# Patient Record
Sex: Female | Born: 1935 | Race: White | Hispanic: No | Marital: Single | Smoking: Never smoker
Health system: Southern US, Community
[De-identification: ages and names within clinical notes are randomized; demographics above are authoritative.]

## PROBLEM LIST (undated history)

## (undated) DIAGNOSIS — I639 Cerebral infarction, unspecified: Secondary | ICD-10-CM

## (undated) DIAGNOSIS — C801 Malignant (primary) neoplasm, unspecified: Secondary | ICD-10-CM

## (undated) DIAGNOSIS — I10 Essential (primary) hypertension: Secondary | ICD-10-CM

## (undated) HISTORY — DX: Cerebral infarction, unspecified: I63.9

## (undated) HISTORY — PX: COLON SURGERY: SHX602

## (undated) HISTORY — PX: JOINT REPLACEMENT: SHX530

---

## 2017-01-14 ENCOUNTER — Emergency Department (HOSPITAL_COMMUNITY): Payer: Medicare Other

## 2017-01-14 ENCOUNTER — Inpatient Hospital Stay (HOSPITAL_COMMUNITY)
Admission: EM | Admit: 2017-01-14 | Discharge: 2017-01-16 | DRG: 055 | Disposition: A | Discharge status: Home / Self Care | Payer: Medicare Other | Attending: Family Medicine | Admitting: Family Medicine

## 2017-01-14 ENCOUNTER — Encounter (HOSPITAL_COMMUNITY): Payer: Self-pay | Admitting: Emergency Medicine

## 2017-01-14 DIAGNOSIS — E876 Hypokalemia: Secondary | ICD-10-CM | POA: Diagnosis present

## 2017-01-14 DIAGNOSIS — I10 Essential (primary) hypertension: Secondary | ICD-10-CM | POA: Diagnosis present

## 2017-01-14 DIAGNOSIS — E785 Hyperlipidemia, unspecified: Secondary | ICD-10-CM | POA: Diagnosis present

## 2017-01-14 DIAGNOSIS — T464X6A Underdosing of angiotensin-converting-enzyme inhibitors, initial encounter: Secondary | ICD-10-CM | POA: Diagnosis present

## 2017-01-14 DIAGNOSIS — C7931 Secondary malignant neoplasm of brain: Principal | ICD-10-CM | POA: Diagnosis present

## 2017-01-14 DIAGNOSIS — R51 Headache: Secondary | ICD-10-CM | POA: Diagnosis present

## 2017-01-14 DIAGNOSIS — M1710 Unilateral primary osteoarthritis, unspecified knee: Secondary | ICD-10-CM | POA: Diagnosis present

## 2017-01-14 DIAGNOSIS — X58XXXA Exposure to other specified factors, initial encounter: Secondary | ICD-10-CM | POA: Diagnosis present

## 2017-01-14 DIAGNOSIS — Z87891 Personal history of nicotine dependence: Secondary | ICD-10-CM

## 2017-01-14 DIAGNOSIS — I639 Cerebral infarction, unspecified: Secondary | ICD-10-CM | POA: Diagnosis present

## 2017-01-14 DIAGNOSIS — M1610 Unilateral primary osteoarthritis, unspecified hip: Secondary | ICD-10-CM | POA: Diagnosis present

## 2017-01-14 DIAGNOSIS — R29810 Facial weakness: Secondary | ICD-10-CM | POA: Diagnosis present

## 2017-01-14 DIAGNOSIS — Z85038 Personal history of other malignant neoplasm of large intestine: Secondary | ICD-10-CM | POA: Diagnosis not present

## 2017-01-14 DIAGNOSIS — R4781 Slurred speech: Secondary | ICD-10-CM | POA: Diagnosis present

## 2017-01-14 DIAGNOSIS — R1310 Dysphagia, unspecified: Secondary | ICD-10-CM | POA: Diagnosis present

## 2017-01-14 DIAGNOSIS — G8191 Hemiplegia, unspecified affecting right dominant side: Secondary | ICD-10-CM | POA: Diagnosis present

## 2017-01-14 DIAGNOSIS — C3490 Malignant neoplasm of unspecified part of unspecified bronchus or lung: Secondary | ICD-10-CM | POA: Diagnosis present

## 2017-01-14 DIAGNOSIS — C7951 Secondary malignant neoplasm of bone: Secondary | ICD-10-CM | POA: Diagnosis present

## 2017-01-14 DIAGNOSIS — Z880 Allergy status to penicillin: Secondary | ICD-10-CM

## 2017-01-14 DIAGNOSIS — R112 Nausea with vomiting, unspecified: Secondary | ICD-10-CM | POA: Diagnosis present

## 2017-01-14 DIAGNOSIS — G9389 Other specified disorders of brain: Secondary | ICD-10-CM | POA: Diagnosis present

## 2017-01-14 DIAGNOSIS — Z923 Personal history of irradiation: Secondary | ICD-10-CM | POA: Diagnosis not present

## 2017-01-14 DIAGNOSIS — T457X6A Underdosing of anticoagulant antagonist, vitamin K and other coagulants, initial encounter: Secondary | ICD-10-CM | POA: Diagnosis present

## 2017-01-14 DIAGNOSIS — Z79899 Other long term (current) drug therapy: Secondary | ICD-10-CM | POA: Diagnosis not present

## 2017-01-14 DIAGNOSIS — Z7901 Long term (current) use of anticoagulants: Secondary | ICD-10-CM

## 2017-01-14 DIAGNOSIS — I6603 Occlusion and stenosis of bilateral middle cerebral arteries: Secondary | ICD-10-CM | POA: Diagnosis present

## 2017-01-14 DIAGNOSIS — Z91128 Patient's intentional underdosing of medication regimen for other reason: Secondary | ICD-10-CM

## 2017-01-14 DIAGNOSIS — I4891 Unspecified atrial fibrillation: Secondary | ICD-10-CM | POA: Diagnosis present

## 2017-01-14 HISTORY — DX: Essential (primary) hypertension: I10

## 2017-01-14 HISTORY — DX: Malignant (primary) neoplasm, unspecified: C80.1

## 2017-01-14 LAB — CBC
HCT: 39.8 % (ref 36.0–46.0)
Hemoglobin: 13.3 g/dL (ref 12.0–15.0)
MCH: 31.1 pg (ref 26.0–34.0)
MCHC: 33.4 g/dL (ref 30.0–36.0)
MCV: 93 fL (ref 78.0–100.0)
PLATELETS: 166 10*3/uL (ref 150–400)
RBC: 4.28 MIL/uL (ref 3.87–5.11)
RDW: 13.5 % (ref 11.5–15.5)
WBC: 6.7 10*3/uL (ref 4.0–10.5)

## 2017-01-14 LAB — DIFFERENTIAL
BASOS PCT: 0 %
Basophils Absolute: 0 10*3/uL (ref 0.0–0.1)
Eosinophils Absolute: 0.1 10*3/uL (ref 0.0–0.7)
Eosinophils Relative: 1 %
Lymphocytes Relative: 18 %
Lymphs Abs: 1.2 10*3/uL (ref 0.7–4.0)
MONO ABS: 0.6 10*3/uL (ref 0.1–1.0)
Monocytes Relative: 9 %
NEUTROS ABS: 4.9 10*3/uL (ref 1.7–7.7)
NEUTROS PCT: 72 %

## 2017-01-14 LAB — I-STAT TROPONIN, ED: Troponin i, poc: 0.09 ng/mL (ref 0.00–0.08)

## 2017-01-14 LAB — URINALYSIS, ROUTINE W REFLEX MICROSCOPIC
Bilirubin Urine: NEGATIVE
Glucose, UA: NEGATIVE mg/dL
Hgb urine dipstick: NEGATIVE
Ketones, ur: NEGATIVE mg/dL
Nitrite: NEGATIVE
Protein, ur: NEGATIVE mg/dL
Specific Gravity, Urine: 1.023 (ref 1.005–1.030)
pH: 7 (ref 5.0–8.0)

## 2017-01-14 LAB — COMPREHENSIVE METABOLIC PANEL
ALBUMIN: 4.1 g/dL (ref 3.5–5.0)
ALT: 10 U/L — AB (ref 14–54)
ANION GAP: 9 (ref 5–15)
AST: 16 U/L (ref 15–41)
Alkaline Phosphatase: 148 U/L — ABNORMAL HIGH (ref 38–126)
BUN: 16 mg/dL (ref 6–20)
CHLORIDE: 104 mmol/L (ref 101–111)
CO2: 26 mmol/L (ref 22–32)
CREATININE: 0.8 mg/dL (ref 0.44–1.00)
Calcium: 9.3 mg/dL (ref 8.9–10.3)
GFR calc non Af Amer: >60 mL/min (ref >60)
Glucose, Bld: 108 mg/dL — ABNORMAL HIGH (ref 65–99)
Potassium: 3.2 mmol/L — ABNORMAL LOW (ref 3.5–5.1)
SODIUM: 139 mmol/L (ref 135–145)
Total Bilirubin: 0.7 mg/dL (ref 0.3–1.2)
Total Protein: 6.3 g/dL — ABNORMAL LOW (ref 6.5–8.1)

## 2017-01-14 LAB — I-STAT CHEM 8, ED
BUN: 19 mg/dL (ref 6–20)
CALCIUM ION: 1.12 mmol/L — AB (ref 1.15–1.40)
CHLORIDE: 102 mmol/L (ref 101–111)
Creatinine, Ser: 0.8 mg/dL (ref 0.44–1.00)
Glucose, Bld: 107 mg/dL — ABNORMAL HIGH (ref 65–99)
HEMATOCRIT: 39 % (ref 36.0–46.0)
Hemoglobin: 13.3 g/dL (ref 12.0–15.0)
POTASSIUM: 3.2 mmol/L — AB (ref 3.5–5.1)
SODIUM: 142 mmol/L (ref 135–145)
TCO2: 28 mmol/L (ref 22–32)

## 2017-01-14 LAB — RAPID URINE DRUG SCREEN, HOSP PERFORMED
Amphetamines: NOT DETECTED
BENZODIAZEPINES: NOT DETECTED
Barbiturates: NOT DETECTED
COCAINE: NOT DETECTED
Opiates: POSITIVE — AB
Tetrahydrocannabinol: NOT DETECTED

## 2017-01-14 LAB — ETHANOL

## 2017-01-14 LAB — APTT: APTT: 24 s (ref 24–36)

## 2017-01-14 LAB — PROTIME-INR
INR: 1.09
PROTHROMBIN TIME: 14 s (ref 11.4–15.2)

## 2017-01-14 MED ORDER — IOPAMIDOL (ISOVUE-370) INJECTION 76%
INTRAVENOUS | Status: AC
  Administered 2017-01-14: 50 mL
  Filled 2017-01-14: qty 50

## 2017-01-14 MED ORDER — DILTIAZEM HCL 30 MG PO TABS: 30.0000 mg | ORAL_TABLET | Freq: Once | ORAL | Status: DC

## 2017-01-14 MED ORDER — HYDRALAZINE HCL 20 MG/ML IJ SOLN: 5.0000 mg | INTRAMUSCULAR | Status: DC | PRN

## 2017-01-14 MED ORDER — ATORVASTATIN CALCIUM 40 MG PO TABS
40.0000 mg | ORAL_TABLET | Freq: Every day | ORAL | Status: DC
  Administered 2017-01-15: 40 mg via ORAL
  Filled 2017-01-14: qty 1

## 2017-01-14 MED ORDER — POTASSIUM CHLORIDE 10 MEQ/100ML IV SOLN
10.0000 meq | INTRAVENOUS | Status: AC
  Administered 2017-01-15 (×3): 10 meq via INTRAVENOUS
  Filled 2017-01-14 (×3): qty 100

## 2017-01-14 NOTE — H&P (Signed)
[facility] Admission History and Physical Service Pager: [phone]  Patient name: Kelly Frost Medical record number: [id number] Date of birth: 10-05-1928 Age: 83 y.o. Gender: female  Primary Care Provider: System, Pcp Not In Consultants: Neurology Code Status: Full  Chief Complaint: R sided weakness, slurred speech   Assessment and Plan: Kelly Frost is a 83 y.o. female presenting with R sided weakness and slurred speech since this evening. PMH is significant for Afib, HTN, lung cancer with mets to spine and brain, and h/o colon cancer.  R sided weakness, slurred speech, r/o stroke Last known well time around 7:40pm this evening. Patient's daughter noticed slurred speech, gaze deviation and stumbling when patient got up from sitting to walk. With no prior history of stroke in the past. Has been missing some days of her medication including Eliquis and Lisinopril due to severe headache after radiation. In the ED, Code Stroke was called, CT head showed no acute intracranial hemorrhage. CTA head/neck with no emergent vessel occlusion but severe stenosis noted in bilateral mid to distal middle cerebral arteries. Patient not endorsing chest pain but istat trop elevated to 0.09. EKG NSR. BP 176/87 on admission.  Labs notable for K 3.2. UDS positive for opiates but takes Norco for hip pain. U/A large leuks, neg nitrites, hyaline casts, few bacteria, TNTC WBC although asymptomatic bacteruria , therefore treatment is not warranted. Difficult to tell if R sided weakness is new as patient's daughter states she does have some R sided weakness at baseline and walks with a cane. However, R sided facial droop and slurred speech is new and present at the time of exam. Current known risk factors for stroke include HTN, afib, FH of stroke. CHADsVASC score 4.  Neurology consulted in ED -- she is not a candidate for tPA and recommend holding home Eliquis for now.  - Admit to telemetry  observation, Dr. Erin Hearing attending -continuous cardiac monitoring  -Neurology following, appreciate recs  - Brain MRI without contrast  - trend trops  - TTE with bubble study  - BMP, CBC - neuro checks q4h - NPO pending swallow study - PT/OT/SLP recs  - risk stratification labs: A1c, lipid panel, TSH -SCDs - vitals per unit routine   Afib  On Eliquis. In regular rhythm on exam with HR 80. Has missed some doses over the last month due to fatigue and headaches from radiation treatment.  More recently, she has a caregiver who helps her with her medications and she therefore has been taking her medication over the past week or so.  - continuous cardiac monitoring - holding eliquis until brain MRI  - ASA 325mg  -will place SCDs for anticoagulation   HTN Allowing for permissive hypertension with parameters. BP 176/87 on admission. On Lisinopril 5 mg daily at home.  - holding home lisinopril - hydralazine PRN for SBP >210 -monitor blood pressures  Hypokalemia K 3.2 on admission.  - replete with KCl 85meq as she is NPO currently  -daily BMET   Hip and leg pain  Likely due to OA, has had for some time. Saw Ortho last week and received injection in her hip. Previously has also had steroid injections in her knee. Takes Norco PRN for pain. - Tylenol Q6 PRN - holding home Norco for now   H/o Lung and Colon cancer  With known mets to brain and spine. S/p 10 day radiation treatment to brain about a month ago. Followed by Dr. Hezzie Bump at [place].    FEN/GI: NPO  until swallow study, SLIV  Prophylaxis: SCDs   Disposition: admit for observation, tele  History of Present Illness:  Kelly Frost is a 83 y.o. female presenting with R sided weakness and slurred speech since this evening. PMH is significant for Afib, HTN, lung cancer with mets to spine and brain, h/o colon cancer.  This evening she was visiting her daughter and son-in-law eating dinner and watching TV when they noticed her speech  was slurred and stumbled when she got up and walked across the room. The daughter states she has had some R sided weakness for some time and normally walks with a cane. She lives alone in [place] and is able to perform all of her ADLs and IADLs at baseline but has been having a caregiver come to her home as she has been more lethargic and not wanting to eat and forgetting to take medications over the past month due to her recent brain radiation that ended a month ago. She did not take her medications this morning.  She also endorses a continued headache for the past month since radiation that is debilitating. It is located in back of head where the tumor used to be.  She follows with Dr. Hezzie Bump at [place].    Review Of Systems: Per HPI with the following additions:   Review of Systems  Respiratory: Negative for shortness of breath.   Cardiovascular: Negative for chest pain.  Neurological: Positive for focal weakness and headaches.   Patient Active Problem List   Diagnosis Date Noted  . Stroke [facility]) 01/14/2017   Past Medical History: Past Medical History:  Diagnosis Date  . Cancer ([place])   . Hypertension    Past Surgical History: Past Surgical History:  Procedure Laterality Date  . COLON SURGERY    . JOINT REPLACEMENT     Social History: Social History  Substance Use Topics  . Smoking status: Never Smoker  . Smokeless tobacco: Never Used  . Alcohol use Yes   Additional social history: Former smoker, quit 30 years ago, smoked 2 PPD x 30 years.  Occasional alcohol use.  No illicit drug use.   Please also refer to relevant sections of EMR.  Family History: No family history on file. Father died from stroke at 11yo   Mother died from heart failure in 38s  Allergies and Medications: Allergies  Allergen Reactions  . Penicillins Shortness Of Breath and Rash   No current facility-administered medications on file prior to encounter.    No current outpatient prescriptions on  file prior to encounter.   Objective: BP (!) 193/81 (BP Location: Right Arm)   Pulse 82   Temp 98.7 F (37.1 C) (Oral)   Resp 16   Ht 5\' 5"  (1.651 m)   Wt 151 lb 8 oz (68.7 kg)   SpO2 96%   BMI 25.21 kg/m   Exam: General: Elderly female lying in bed, in NAD Eyes: EOMI  Cardiovascular: RRR, systolic murmur  Respiratory: CTA bilaterally, no wheezes/crackles  Gastrointestinal: soft, slightly tender to palpation in RLQ, non distended  Derm: WWP, no LE edema  MSK: Full ROM, strength 5/5 to U/LE bilaterally.   Neuro: Alert and oriented, speech slurred. Extraocular movements intact.  Intact symmetric sensation to light touch of face and extremities bilaterally.  Hearing grossly intact bilaterally.  Tongue protrudes normally with no deviation. Shoulder shrug symmetric. R sided facial droop. Finger to nose slowed but normal.   Labs and Imaging: CBC BMET   Recent Labs Lab 01/14/17 2032  01/14/17 2038  WBC 6.7  --   HGB 13.3 13.3  HCT 39.8 39.0  PLT 166  --     Recent Labs Lab 01/14/17 2032 01/14/17 2038  NA 139 142  K 3.2* 3.2*  CL 104 102  CO2 26  --   BUN 16 19  CREATININE 0.80 0.80  GLUCOSE 108* 107*  CALCIUM 9.3  --      Ct Angio Head W Or Wo Contrast  Result Date: 01/14/2017 CLINICAL DATA:  Follow-up stroke. Slurred speech. History of hypertension and cancer. EXAM: CT ANGIOGRAPHY HEAD AND NECK TECHNIQUE: Multidetector CT imaging of the head and neck was performed using the standard protocol during bolus administration of intravenous contrast. Multiplanar CT image reconstructions and MIPs were obtained to evaluate the vascular anatomy. Carotid stenosis measurements (when applicable) are obtained utilizing NASCET criteria, using the distal internal carotid diameter as the denominator. CONTRAST:  50 cc Isovue 370 COMPARISON:  CT HEAD January 14, 2017 at 2038 hours FINDINGS: CTA NECK AORTIC ARCH: Normal appearance of the thoracic arch, mild calcific atherosclerosis The  origins of the innominate, left Common carotid artery and subclavian artery are widely patent. RIGHT CAROTID SYSTEM: Common carotid artery is widely patent, mild calcific atherosclerosis. Mild eccentric calcific atherosclerosis carotid bifurcation without hemodynamically significant stenosis by NASCET criteria. Normal appearance of the included internal carotid artery. LEFT CAROTID SYSTEM: Common carotid artery is widely patent, mild calcific atherosclerosis. Moderate eccentric calcific atherosclerosis carotid bifurcation without hemodynamically significant stenosis by NASCET criteria. Normal appearance of the included internal carotid artery. VERTEBRAL ARTERIES:Codominant vertebral artery's. Vertebral artery's are widely patent, mild extrinsic compression due to degenerative cervical spine. SKELETON: No acute osseous process though bone windows have not been submitted. Multilevel severe cervical spondylosis with facet fusion. OTHER NECK: Soft tissues of the neck are nonacute though, not tailored for evaluation. Subcentimeter RIGHT thyroid nodule, below size followup recommendation. UPPER CHEST: Small layering LEFT pleural effusion. Biapical pleuroparenchymal scarring. Calcified granuloma RIGHT lung apex. CTA HEAD ANTERIOR CIRCULATION: Patent cervical internal carotid arteries, petrous, cavernous and supra clinoid internal carotid arteries. Widely patent anterior communicating artery. Patent anterior and middle cerebral arteries. Severe stenosis RIGHT M3 segments. Moderate general middle cerebral artery luminal regularity. Dolichoectasia seen with chronic hypertension. No large vessel occlusion, contrast extravasation or aneurysm. POSTERIOR CIRCULATION: Patent vertebral arteries, vertebrobasilar junction and basilar artery, as well as main branch vessels. Mild stenosis LEFT P1 origin. Patent posterior cerebral arteries, moderate to severe tandem stenoses. Thready bilateral posterior communicating artery's present.  No large vessel occlusion, contrast extravasation or aneurysm. VENOUS SINUSES: Major dural venous sinuses are patent though not tailored for evaluation on this angiographic examination. ANATOMIC VARIANTS: None. DELAYED PHASE: Not performed. MIP images reviewed. IMPRESSION: CTA NECK: 1. Atherosclerosis without hemodynamically significant stenosis or acute vascular process. 2. Small LEFT pleural effusion.  Recommend chest radiograph. CTA HEAD: 1. No emergent vessel occlusion. 2. Intracranial atherosclerosis resulting in moderate to severe stenoses bilateral posterior cerebral artery's, severe stenosis bilateral mid to distal middle cerebral artery's. Electronically Signed   By: Elon Alas M.D.   On: 01/14/2017 21:26   Ct Angio Neck W Or Wo Contrast  Result Date: 01/14/2017 CLINICAL DATA:  Follow-up stroke. Slurred speech. History of hypertension and cancer. EXAM: CT ANGIOGRAPHY HEAD AND NECK TECHNIQUE: Multidetector CT imaging of the head and neck was performed using the standard protocol during bolus administration of intravenous contrast. Multiplanar CT image reconstructions and MIPs were obtained to evaluate the vascular anatomy. Carotid stenosis measurements (when applicable) are  obtained utilizing NASCET criteria, using the distal internal carotid diameter as the denominator. CONTRAST:  50 cc Isovue 370 COMPARISON:  CT HEAD January 14, 2017 at 2038 hours FINDINGS: CTA NECK AORTIC ARCH: Normal appearance of the thoracic arch, mild calcific atherosclerosis The origins of the innominate, left Common carotid artery and subclavian artery are widely patent. RIGHT CAROTID SYSTEM: Common carotid artery is widely patent, mild calcific atherosclerosis. Mild eccentric calcific atherosclerosis carotid bifurcation without hemodynamically significant stenosis by NASCET criteria. Normal appearance of the included internal carotid artery. LEFT CAROTID SYSTEM: Common carotid artery is widely patent, mild calcific  atherosclerosis. Moderate eccentric calcific atherosclerosis carotid bifurcation without hemodynamically significant stenosis by NASCET criteria. Normal appearance of the included internal carotid artery. VERTEBRAL ARTERIES:Codominant vertebral artery's. Vertebral artery's are widely patent, mild extrinsic compression due to degenerative cervical spine. SKELETON: No acute osseous process though bone windows have not been submitted. Multilevel severe cervical spondylosis with facet fusion. OTHER NECK: Soft tissues of the neck are nonacute though, not tailored for evaluation. Subcentimeter RIGHT thyroid nodule, below size followup recommendation. UPPER CHEST: Small layering LEFT pleural effusion. Biapical pleuroparenchymal scarring. Calcified granuloma RIGHT lung apex. CTA HEAD ANTERIOR CIRCULATION: Patent cervical internal carotid arteries, petrous, cavernous and supra clinoid internal carotid arteries. Widely patent anterior communicating artery. Patent anterior and middle cerebral arteries. Severe stenosis RIGHT M3 segments. Moderate general middle cerebral artery luminal regularity. Dolichoectasia seen with chronic hypertension. No large vessel occlusion, contrast extravasation or aneurysm. POSTERIOR CIRCULATION: Patent vertebral arteries, vertebrobasilar junction and basilar artery, as well as main branch vessels. Mild stenosis LEFT P1 origin. Patent posterior cerebral arteries, moderate to severe tandem stenoses. Thready bilateral posterior communicating artery's present. No large vessel occlusion, contrast extravasation or aneurysm. VENOUS SINUSES: Major dural venous sinuses are patent though not tailored for evaluation on this angiographic examination. ANATOMIC VARIANTS: None. DELAYED PHASE: Not performed. MIP images reviewed. IMPRESSION: CTA NECK: 1. Atherosclerosis without hemodynamically significant stenosis or acute vascular process. 2. Small LEFT pleural effusion.  Recommend chest radiograph. CTA HEAD: 1.  No emergent vessel occlusion. 2. Intracranial atherosclerosis resulting in moderate to severe stenoses bilateral posterior cerebral artery's, severe stenosis bilateral mid to distal middle cerebral artery's. Electronically Signed   By: Elon Alas M.D.   On: 01/14/2017 21:26   Ct Head Code Stroke Wo Contrast  Result Date: 01/14/2017 CLINICAL DATA:  Code stroke. 83 year old female last seen normal 1830 hours. Slurred speech. EXAM: CT HEAD WITHOUT CONTRAST TECHNIQUE: Contiguous axial images were obtained from the base of the skull through the vertex without intravenous contrast. COMPARISON:  None. FINDINGS: Brain: No midline shift, mass effect, or evidence of intracranial mass lesion. Ventricle size and cerebral volume within normal limits for age. Mild motion artifact. No acute intracranial hemorrhage identified. Patchy and confluent bilateral cerebral white matter hypodensity. Chronic encephalomalacia in the left occipital pole. No definite No cortically based acute infarct identified. Vascular: No suspicious intracranial vascular hyperdensity. Intracranial artery dolichoectasia. Skull: Nonspecific confluent sclerosis along the anterior and left C1 ring. Visible bone mineralization elsewhere is within normal limits. Mild motion artifact. Sinuses/Orbits: Clear. Other: No acute orbit or scalp soft tissue findings. ASPECTS [facility] Stroke Program Early CT Score) - Ganglionic level infarction (caudate, lentiform nuclei, internal capsule, insula, M1-M3 cortex): 7 - Supraganglionic infarction (M4-M6 cortex): 3 Total score (0-10 with 10 being normal): 10 IMPRESSION: 1. Motion artifact, but no acute intracranial hemorrhage or cortically based acute infarct identified. 2. ASPECTS is 10. 3. Chronic left PCA territory infarct. Cerebral white matter disease. 4.  The above was relayed via text pager to S. Aroor on 01/14/2017 at 20:45 . Electronically Signed   By: Genevie Ann M.D.   On: 01/14/2017 20:46   Rory Percy,  DO 01/15/2017 PGY-1, [place]  I have seen and evaluated the above patient with Dr. Ky Barban and agree with her documentation.  My edits are included in blue.   Lovenia Kim, MD 01/15/2017, 6:00 AM PGY-2, [place] Intern pager: [phone], text pages welcome

## 2017-01-14 NOTE — Consult Note (Signed)
Requesting Physician: Dr. Reesa Chew    Chief Complaint:  Slurred speech  History obtained from:  Patient  and daughter  HPI:                                                                                                                                       Kelly Frost is an 83 y.o. female white right-handed with past medical history of lung cancer with metastases to the brain and spine, atrial fibrillation hypertension who presented as a stroke alert for sudden onset slurred speech and right-sided weakness.  Patient arrived around 8: 40 5 PM. She has slurred speech, right hemiparesis and right visual neglect. NIHSSwas 8. Initial blood pressure was 841 systolic. The EMS state the patient missed her morning dose of Eliquis however did take her nighttime dose which was confirmed later when the daughter arrived. CT head showed left occipital encephalomalacia likely from her metastatic brain lesion status post radiation. CTA  head showed no evidence of large vessel occlusion. It did slightly improve during her time in the emergency room.  The patient has been complaining of severe headache this radiation treatment and which has not responded to morphine.   Date last known well: 9.28.18 Time last known well: 7.30 pm  tPA Given: No, on Eliquis. Miss morning dose however took last night's dose NIHSS: 8 Baseline MRS 0    Past Medical History:  Diagnosis Date  . Cancer ([place])   . Hypertension     Past Surgical History:  Procedure Laterality Date  . COLON SURGERY    . JOINT REPLACEMENT      No family history on file. Social History:  reports that she has never smoked. She has never used smokeless tobacco. She reports that she drinks alcohol. She reports that she does not use drugs.  Allergies:  Allergies  Allergen Reactions  . Penicillins Shortness Of Breath and Rash    Medications:                                                                                                                         I reviewed home medications   ROS:  14 systems reviewed and negative except above    Examination:                                                                                                      General: Appears well-developed and well-nourished.  Psych: Affect appropriate to situation Eyes: No scleral injection HENT: No OP obstrucion Head: Normocephalic.  Cardiovascular: Normal rate and regular rhythm.  Respiratory: Effort normal and breath sounds normal to anterior ascultation GI: Soft.  No distension. There is no tenderness.  Skin: WDI   Neurological Examination Mental Status: Alert, oriented, thought content appropriate.  Speech fluent without evidence of aphasia.  Able to follow 3 step commands without difficulty. Cranial Nerves: II:  Motor: Right : Upper extremity   5/5    Left:     Upper extremity   5/5  Lower extremity   5/5     Lower extremity   5/5 Tone and bulk:normal tone throughout; no atrophy noted Sensory: Pinprick and light touch intact throughout, bilaterally Deep Tendon Reflexes: 2+ and symmetric throughout Plantars: Right: downgoing   Left: downgoing Cerebellar: normal finger-to-nose, normal rapid alternating movements and normal heel-to-shin test Gait: normal gait and station     Lab Results: Basic Metabolic Panel:  Recent Labs Lab 01/14/17 2032 01/14/17 2038  NA 139 142  K 3.2* 3.2*  CL 104 102  CO2 26  --   GLUCOSE 108* 107*  BUN 16 19  CREATININE 0.80 0.80  CALCIUM 9.3  --     CBC:  Recent Labs Lab 01/14/17 2032 01/14/17 2038  WBC 6.7  --   NEUTROABS 4.9  --   HGB 13.3 13.3  HCT 39.8 39.0  MCV 93.0  --   PLT 166  --     Coagulation Studies:  Recent Labs  01/14/17 2032  LABPROT 14.0  INR 1.09    Imaging: Ct Angio Head W Or Wo Contrast  Result Date:  01/14/2017 CLINICAL DATA:  Follow-up stroke. Slurred speech. History of hypertension and cancer. EXAM: CT ANGIOGRAPHY HEAD AND NECK TECHNIQUE: Multidetector CT imaging of the head and neck was performed using the standard protocol during bolus administration of intravenous contrast. Multiplanar CT image reconstructions and MIPs were obtained to evaluate the vascular anatomy. Carotid stenosis measurements (when applicable) are obtained utilizing NASCET criteria, using the distal internal carotid diameter as the denominator. CONTRAST:  50 cc Isovue 370 COMPARISON:  CT HEAD January 14, 2017 at 2038 hours FINDINGS: CTA NECK AORTIC ARCH: Normal appearance of the thoracic arch, mild calcific atherosclerosis The origins of the innominate, left Common carotid artery and subclavian artery are widely patent. RIGHT CAROTID SYSTEM: Common carotid artery is widely patent, mild calcific atherosclerosis. Mild eccentric calcific atherosclerosis carotid bifurcation without hemodynamically significant stenosis by NASCET criteria. Normal appearance of the included internal carotid artery. LEFT CAROTID SYSTEM: Common carotid artery is widely patent, mild calcific atherosclerosis. Moderate eccentric calcific atherosclerosis carotid bifurcation without hemodynamically significant stenosis by NASCET criteria. Normal appearance of the included internal carotid artery. VERTEBRAL ARTERIES:Codominant vertebral artery's. Vertebral artery's are widely patent, mild extrinsic compression  due to degenerative cervical spine. SKELETON: No acute osseous process though bone windows have not been submitted. Multilevel severe cervical spondylosis with facet fusion. OTHER NECK: Soft tissues of the neck are nonacute though, not tailored for evaluation. Subcentimeter RIGHT thyroid nodule, below size followup recommendation. UPPER CHEST: Small layering LEFT pleural effusion. Biapical pleuroparenchymal scarring. Calcified granuloma RIGHT lung apex. CTA  HEAD ANTERIOR CIRCULATION: Patent cervical internal carotid arteries, petrous, cavernous and supra clinoid internal carotid arteries. Widely patent anterior communicating artery. Patent anterior and middle cerebral arteries. Severe stenosis RIGHT M3 segments. Moderate general middle cerebral artery luminal regularity. Dolichoectasia seen with chronic hypertension. No large vessel occlusion, contrast extravasation or aneurysm. POSTERIOR CIRCULATION: Patent vertebral arteries, vertebrobasilar junction and basilar artery, as well as main branch vessels. Mild stenosis LEFT P1 origin. Patent posterior cerebral arteries, moderate to severe tandem stenoses. Thready bilateral posterior communicating artery's present. No large vessel occlusion, contrast extravasation or aneurysm. VENOUS SINUSES: Major dural venous sinuses are patent though not tailored for evaluation on this angiographic examination. ANATOMIC VARIANTS: None. DELAYED PHASE: Not performed. MIP images reviewed. IMPRESSION: CTA NECK: 1. Atherosclerosis without hemodynamically significant stenosis or acute vascular process. 2. Small LEFT pleural effusion.  Recommend chest radiograph. CTA HEAD: 1. No emergent vessel occlusion. 2. Intracranial atherosclerosis resulting in moderate to severe stenoses bilateral posterior cerebral artery's, severe stenosis bilateral mid to distal middle cerebral artery's. Electronically Signed   By: Elon Alas M.D.   On: 01/14/2017 21:26   Ct Angio Neck W Or Wo Contrast  Result Date: 01/14/2017 CLINICAL DATA:  Follow-up stroke. Slurred speech. History of hypertension and cancer. EXAM: CT ANGIOGRAPHY HEAD AND NECK TECHNIQUE: Multidetector CT imaging of the head and neck was performed using the standard protocol during bolus administration of intravenous contrast. Multiplanar CT image reconstructions and MIPs were obtained to evaluate the vascular anatomy. Carotid stenosis measurements (when applicable) are obtained utilizing  NASCET criteria, using the distal internal carotid diameter as the denominator. CONTRAST:  50 cc Isovue 370 COMPARISON:  CT HEAD January 14, 2017 at 2038 hours FINDINGS: CTA NECK AORTIC ARCH: Normal appearance of the thoracic arch, mild calcific atherosclerosis The origins of the innominate, left Common carotid artery and subclavian artery are widely patent. RIGHT CAROTID SYSTEM: Common carotid artery is widely patent, mild calcific atherosclerosis. Mild eccentric calcific atherosclerosis carotid bifurcation without hemodynamically significant stenosis by NASCET criteria. Normal appearance of the included internal carotid artery. LEFT CAROTID SYSTEM: Common carotid artery is widely patent, mild calcific atherosclerosis. Moderate eccentric calcific atherosclerosis carotid bifurcation without hemodynamically significant stenosis by NASCET criteria. Normal appearance of the included internal carotid artery. VERTEBRAL ARTERIES:Codominant vertebral artery's. Vertebral artery's are widely patent, mild extrinsic compression due to degenerative cervical spine. SKELETON: No acute osseous process though bone windows have not been submitted. Multilevel severe cervical spondylosis with facet fusion. OTHER NECK: Soft tissues of the neck are nonacute though, not tailored for evaluation. Subcentimeter RIGHT thyroid nodule, below size followup recommendation. UPPER CHEST: Small layering LEFT pleural effusion. Biapical pleuroparenchymal scarring. Calcified granuloma RIGHT lung apex. CTA HEAD ANTERIOR CIRCULATION: Patent cervical internal carotid arteries, petrous, cavernous and supra clinoid internal carotid arteries. Widely patent anterior communicating artery. Patent anterior and middle cerebral arteries. Severe stenosis RIGHT M3 segments. Moderate general middle cerebral artery luminal regularity. Dolichoectasia seen with chronic hypertension. No large vessel occlusion, contrast extravasation or aneurysm. POSTERIOR CIRCULATION:  Patent vertebral arteries, vertebrobasilar junction and basilar artery, as well as main branch vessels. Mild stenosis LEFT P1 origin. Patent posterior cerebral arteries, moderate  to severe tandem stenoses. Thready bilateral posterior communicating artery's present. No large vessel occlusion, contrast extravasation or aneurysm. VENOUS SINUSES: Major dural venous sinuses are patent though not tailored for evaluation on this angiographic examination. ANATOMIC VARIANTS: None. DELAYED PHASE: Not performed. MIP images reviewed. IMPRESSION: CTA NECK: 1. Atherosclerosis without hemodynamically significant stenosis or acute vascular process. 2. Small LEFT pleural effusion.  Recommend chest radiograph. CTA HEAD: 1. No emergent vessel occlusion. 2. Intracranial atherosclerosis resulting in moderate to severe stenoses bilateral posterior cerebral artery's, severe stenosis bilateral mid to distal middle cerebral artery's. Electronically Signed   By: Elon Alas M.D.   On: 01/14/2017 21:26   Ct Head Code Stroke Wo Contrast  Result Date: 01/14/2017 CLINICAL DATA:  Code stroke. 83 year old female last seen normal 1830 hours. Slurred speech. EXAM: CT HEAD WITHOUT CONTRAST TECHNIQUE: Contiguous axial images were obtained from the base of the skull through the vertex without intravenous contrast. COMPARISON:  None. FINDINGS: Brain: No midline shift, mass effect, or evidence of intracranial mass lesion. Ventricle size and cerebral volume within normal limits for age. Mild motion artifact. No acute intracranial hemorrhage identified. Patchy and confluent bilateral cerebral white matter hypodensity. Chronic encephalomalacia in the left occipital pole. No definite No cortically based acute infarct identified. Vascular: No suspicious intracranial vascular hyperdensity. Intracranial artery dolichoectasia. Skull: Nonspecific confluent sclerosis along the anterior and left C1 ring. Visible bone mineralization elsewhere is within  normal limits. Mild motion artifact. Sinuses/Orbits: Clear. Other: No acute orbit or scalp soft tissue findings. ASPECTS [facility] Stroke Program Early CT Score) - Ganglionic level infarction (caudate, lentiform nuclei, internal capsule, insula, M1-M3 cortex): 7 - Supraganglionic infarction (M4-M6 cortex): 3 Total score (0-10 with 10 being normal): 10 IMPRESSION: 1. Motion artifact, but no acute intracranial hemorrhage or cortically based acute infarct identified. 2. ASPECTS is 10. 3. Chronic left PCA territory infarct. Cerebral white matter disease. 4. The above was relayed via text pager to S. Aroor on 01/14/2017 at 20:45 . Electronically Signed   By: Genevie Ann M.D.   On: 01/14/2017 20:46       ASSESSMENT AND PLAN  49 y female with Afib on Eliquis who missed morning dose, HTN, Mestastic Lung cancer to Brain, presents with slurred speech, side weakness, mild aphasia and R superior quadrantanopsia. 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 She did not receive tPA as she still had yesterday's night time dose.     Plan Hold tonight's Eliquis dose until MRI, will need to determine size of stroke before deciding when its safe to resume Anticoagulation.  ASA 325 mg for now SCD for now  Rest of stroke workup  # MRI of the brain without contrast # Already completed vascular imaging  #Transthoracic Echo  #Start or continue Atorvastatin 40 mg/other high intensity statin # BP goal: permissive HTN upto 676 systolic, PRNs above 21 # HBAIC and Lipid profile # Telemetry monitoring # Frequent neuro checks # NPO until passes stroke swallow screen   Atrial Fibrillation Continue Metoprolol for rate control Hold Eliquis until Mri Brain   Headache Avoid NSAIDS     Please page stroke NP  Or  PA  Or MD from 8am -4 pm  as this patient from this time will be  followed by the  stroke.   You can look them up on [email or web address]  Password [facility]    Sushanth Aroor [facility] Pager Number [phone]

## 2017-01-14 NOTE — ED Triage Notes (Signed)
Per EMS, pt LKW 1830 tonight, at [place], pt found to be having right sided weakness and slurred speech. Hx brain tumor with radiation. Currently taking eliquis. BP 200/102, CBG 120.

## 2017-01-15 ENCOUNTER — Inpatient Hospital Stay (HOSPITAL_COMMUNITY): Payer: Medicare Other

## 2017-01-15 DIAGNOSIS — I639 Cerebral infarction, unspecified: Secondary | ICD-10-CM

## 2017-01-15 LAB — GLUCOSE, CAPILLARY: Glucose-Capillary: 106 mg/dL — ABNORMAL HIGH (ref 65–99)

## 2017-01-15 LAB — TROPONIN I
TROPONIN I: 0.07 ng/mL — AB (ref <0.03)
TROPONIN I: 0.07 ng/mL — AB (ref <0.03)
TROPONIN I: 0.09 ng/mL — AB (ref <0.03)

## 2017-01-15 LAB — CBC
HEMATOCRIT: 38.5 % (ref 36.0–46.0)
Hemoglobin: 12.5 g/dL (ref 12.0–15.0)
MCH: 30.1 pg (ref 26.0–34.0)
MCHC: 32.5 g/dL (ref 30.0–36.0)
MCV: 92.8 fL (ref 78.0–100.0)
Platelets: 155 10*3/uL (ref 150–400)
RBC: 4.15 MIL/uL (ref 3.87–5.11)
RDW: 13.4 % (ref 11.5–15.5)
WBC: 7.8 10*3/uL (ref 4.0–10.5)

## 2017-01-15 LAB — LIPID PANEL
Cholesterol: 167 mg/dL (ref 0–200)
HDL: 54 mg/dL (ref >40)
LDL Cholesterol: 95 mg/dL (ref 0–99)
Total CHOL/HDL Ratio: 3.1 RATIO
Triglycerides: 89 mg/dL (ref <150)
VLDL: 18 mg/dL (ref 0–40)

## 2017-01-15 LAB — HEMOGLOBIN A1C
Hgb A1c MFr Bld: 5.3 % (ref 4.8–5.6)
Mean Plasma Glucose: 105.41 mg/dL

## 2017-01-15 LAB — BASIC METABOLIC PANEL
ANION GAP: 8 (ref 5–15)
BUN: 11 mg/dL (ref 6–20)
CALCIUM: 9.1 mg/dL (ref 8.9–10.3)
CO2: 23 mmol/L (ref 22–32)
Chloride: 108 mmol/L (ref 101–111)
Creatinine, Ser: 0.65 mg/dL (ref 0.44–1.00)
GFR calc Af Amer: >60 mL/min (ref >60)
GFR calc non Af Amer: >60 mL/min (ref >60)
GLUCOSE: 119 mg/dL — AB (ref 65–99)
POTASSIUM: 3.7 mmol/L (ref 3.5–5.1)
Sodium: 139 mmol/L (ref 135–145)

## 2017-01-15 LAB — TSH: TSH: 1.854 u[IU]/mL (ref 0.350–4.500)

## 2017-01-15 MED ORDER — APIXABAN 5 MG PO TABS
5.0000 mg | ORAL_TABLET | Freq: Two times a day (BID) | ORAL | Status: DC
  Administered 2017-01-15 – 2017-01-16 (×2): 5 mg via ORAL
  Filled 2017-01-15 (×2): qty 1

## 2017-01-15 MED ORDER — ONDANSETRON HCL 4 MG/2ML IJ SOLN
4.0000 mg | Freq: Four times a day (QID) | INTRAMUSCULAR | Status: DC | PRN
  Administered 2017-01-15: 4 mg via INTRAVENOUS
  Filled 2017-01-15: qty 2

## 2017-01-15 MED ORDER — MORPHINE SULFATE (PF) 4 MG/ML IV SOLN
4.0000 mg | INTRAVENOUS | Status: AC | PRN
  Administered 2017-01-15: 4 mg via INTRAVENOUS
  Filled 2017-01-15: qty 1

## 2017-01-15 MED ORDER — HYDROCODONE-ACETAMINOPHEN 5-325 MG PO TABS: 0.5000 | ORAL_TABLET | Freq: Four times a day (QID) | ORAL | Status: DC | PRN

## 2017-01-15 MED ORDER — ASPIRIN EC 81 MG PO TBEC
81.0000 mg | DELAYED_RELEASE_TABLET | Freq: Every day | ORAL | Status: DC
  Administered 2017-01-15 – 2017-01-16 (×2): 81 mg via ORAL
  Filled 2017-01-15 (×2): qty 1

## 2017-01-15 MED ORDER — ACETAMINOPHEN 325 MG PO TABS: 650.0000 mg | ORAL_TABLET | Freq: Four times a day (QID) | ORAL | Status: DC | PRN

## 2017-01-15 MED ORDER — ONDANSETRON HCL 4 MG PO TABS
4.0000 mg | ORAL_TABLET | Freq: Four times a day (QID) | ORAL | Status: DC | PRN
  Administered 2017-01-16: 4 mg via ORAL
  Filled 2017-01-15: qty 1

## 2017-01-15 MED ORDER — ATORVASTATIN CALCIUM 10 MG PO TABS: 20.0000 mg | ORAL_TABLET | Freq: Every day | ORAL | Status: DC

## 2017-01-15 MED ORDER — ACETAMINOPHEN 650 MG RE SUPP
650.0000 mg | Freq: Four times a day (QID) | RECTAL | Status: DC | PRN
  Administered 2017-01-15: 650 mg via RECTAL
  Filled 2017-01-15: qty 1

## 2017-01-15 MED ORDER — POLYETHYLENE GLYCOL 3350 17 G PO PACK: 17.0000 g | PACK | Freq: Every day | ORAL | Status: DC | PRN

## 2017-01-15 MED ORDER — ACETAMINOPHEN 650 MG RE SUPP: 650.0000 mg | Freq: Four times a day (QID) | RECTAL | Status: DC | PRN

## 2017-01-15 NOTE — Evaluation (Signed)
Clinical/Bedside Swallow Evaluation Patient Details  Name: Kelly Frost MRN: [id number] Date of Birth: April 24, 1928  Today's Date: 01/15/2017 Time: SLP Start Time (ACUTE ONLY): 0930 SLP Stop Time (ACUTE ONLY): 0950 SLP Time Calculation (min) (ACUTE ONLY): 20 min  Past Medical History:  Past Medical History:  Diagnosis Date  . Cancer ([place])   . Hypertension    Past Surgical History:  Past Surgical History:  Procedure Laterality Date  . COLON SURGERY    . JOINT REPLACEMENT     HPI:  Pt is an 83 y.o. female PMH of Afib, HTN, lung cancer with mets to spine and brain, and h/o colon cancer who presented to ED 9/28 with R sided weakness and slurred speech. Since brain radiation ended a month ago, pt has been more lethargic, poor appetite, forgetting to take meds. MRI showed Small acute infarcts in the left greater than right posterior frontal lobes, left temporo occipital junction, and possibly left cerebellum. Pt failed RN swallow screen; reported "significant" after water via cup. Bedside swallow eval ordered.   Assessment / Plan / Recommendation Clinical Impression  Pt showed no overt s/s of aspiration across consistencies trialed. Pt with generalized weakness and R side droop; showed significant difficulty biting into/ chewing regular solid consistency. Vocal quality was also significantly reduced/ hoarse and the pt indicated that this was an acute symptom. Given these findings, recommend initiating dysphagia 3 diet/ thin liquids, meds whole in liquid, intermittent supervision during meals to check on pt positioning. Pt independently consuming POs slowly in small bites/ sips. Will f/u for diet tolerance. Would also recommend ordering a cognitive-linguistic evaluation given dysarthria.  SLP Visit Diagnosis: Dysphagia, unspecified (R13.10)    Aspiration Risk  Mild aspiration risk    Diet Recommendation Dysphagia 3 (Mech soft);Thin liquid   Liquid Administration via: Cup;Straw Medication  Administration: Whole meds with liquid Supervision: Patient able to self feed;Intermittent supervision to cue for compensatory strategies Compensations: Slow rate;Small sips/bites Postural Changes: Seated upright at 90 degrees    Other  Recommendations Oral Care Recommendations: Oral care BID   Follow up Recommendations Other (comment) (TBD)      Frequency and Duration min 1 x/week  1 week       Prognosis Prognosis for Safe Diet Advancement: Good Barriers to Reach Goals: Severity of deficits      Swallow Study   General HPI: Pt is an 83 y.o. female PMH of Afib, HTN, lung cancer with mets to spine and brain, and h/o colon cancer who presented to ED 9/28 with R sided weakness and slurred speech. Since brain radiation ended a month ago, pt has been more lethargic, poor appetite, forgetting to take meds. MRI showed Small acute infarcts in the left greater than right posterior frontal lobes, left temporo occipital junction, and possibly left cerebellum. Pt failed RN swallow screen; reported "significant" after water via cup. Bedside swallow eval ordered. Type of Study: Bedside Swallow Evaluation Previous Swallow Assessment: none in chart Diet Prior to this Study: NPO Temperature Spikes Noted: No Respiratory Status: Room air History of Recent Intubation: No Behavior/Cognition: Alert;Cooperative Oral Cavity Assessment: Within Functional Limits Oral Cavity - Dentition: Adequate natural dentition Vision: Functional for self-feeding Self-Feeding Abilities: Able to feed self Patient Positioning: Upright in bed Baseline Vocal Quality: Hoarse;Low vocal intensity Volitional Cough: Strong Volitional Swallow: Able to elicit    Oral/Motor/Sensory Function Overall Oral Motor/Sensory Function: Moderate impairment Facial ROM: Reduced right;Suspected CN VII (facial) dysfunction Facial Symmetry: Abnormal symmetry right;Suspected CN VII (facial) dysfunction Facial Strength: Reduced  right;Suspected  CN VII (facial) dysfunction Facial Sensation: Within Functional Limits Lingual ROM: Within Functional Limits Lingual Symmetry: Within Functional Limits Lingual Strength: Within Functional Limits Lingual Sensation: Within Functional Limits   Ice Chips Ice chips: Not tested   Thin Liquid Thin Liquid: Within functional limits Presentation: Cup;Straw    Nectar Thick Nectar Thick Liquid: Not tested   Honey Thick Honey Thick Liquid: Not tested   Puree Puree: Within functional limits   Solid   GO   Solid: Impaired Presentation: Self Fed Oral Phase Impairments: Impaired mastication Oral Phase Functional Implications: Impaired mastication        Amy K Oleksiak, MA, CCC-SLP 01/15/2017,10:00 AM

## 2017-01-15 NOTE — Progress Notes (Signed)
STROKE TEAM PROGRESS NOTE   HISTORY OF PRESENT ILLNESS (per record) Kelly Frost is an 83 y.o. female white right-handed with past medical history of lung cancer with metastases to the brain and spine, atrial fibrillation and hypertension who presented as a stroke alert for sudden onset slurred speech and right-sided weakness.  Patient arrived around 8: 40 5 PM. She has slurred speech, right hemiparesis and right visual neglect. NIHSSwas 8. Initial blood pressure was 196 systolic. The EMS state the patient missed her morning dose of Eliquis however did take her nighttime dose which was confirmed later when the daughter arrived. CT head showed left occipital encephalomalacia likely from her metastatic brain lesion status post radiation. CTA  head showed no evidence of large vessel occlusion. It did slightly improve during her time in the emergency room.  The patient has been complaining of severe headache this radiation treatment and which has not responded to morphine.  Date last known well: 9.28.18 Time last known well: 7.30 pm  tPA Given: No, on Eliquis. Miss morning dose however took last night's dose NIHSS: 8 Baseline MRS 0   SUBJECTIVE (INTERVAL HISTORY) Her son-in-law is at bedside, she has a significant headache and was just given morphine   OBJECTIVE Temp:  [98.3 F (36.8 C)-98.8 F (37.1 C)] 98.6 F (37 C) (09/29 1335) Pulse Rate:  [74-82] 78 (09/29 1335) Cardiac Rhythm: Normal sinus rhythm (09/29 0700) Resp:  [14-18] 16 (09/29 1335) BP: (152-200)/(61-89) 152/64 (09/29 1335) SpO2:  [92 %-97 %] 96 % (09/29 1335) Weight:  [151 lb 8 oz (68.7 kg)] 151 lb 8 oz (68.7 kg) (09/28 2340)  CBC:   Recent Labs Lab 01/14/17 2032 01/14/17 2038 01/15/17 0629  WBC 6.7  --  7.8  NEUTROABS 4.9  --   --   HGB 13.3 13.3 12.5  HCT 39.8 39.0 38.5  MCV 93.0  --  92.8  PLT 166  --  222    Basic Metabolic Panel:   Recent Labs Lab 01/14/17 2032 01/14/17 2038 01/15/17 0629  NA  139 142 139  K 3.2* 3.2* 3.7  CL 104 102 108  CO2 26  --  23  GLUCOSE 108* 107* 119*  BUN 16 19 11   CREATININE 0.80 0.80 0.65  CALCIUM 9.3  --  9.1    Lipid Panel:     Component Value Date/Time   CHOL 167 01/15/2017 0035   TRIG 89 01/15/2017 0035   HDL 54 01/15/2017 0035   CHOLHDL 3.1 01/15/2017 0035   VLDL 18 01/15/2017 0035   LDLCALC 95 01/15/2017 0035   HgbA1c:  Lab Results  Component Value Date   HGBA1C 5.3 01/15/2017   Urine Drug Screen:     Component Value Date/Time   LABOPIA POSITIVE (A) 01/14/2017 2128   COCAINSCRNUR NONE DETECTED 01/14/2017 2128   LABBENZ NONE DETECTED 01/14/2017 2128   AMPHETMU NONE DETECTED 01/14/2017 2128   THCU NONE DETECTED 01/14/2017 2128   LABBARB NONE DETECTED 01/14/2017 2128    Alcohol Level     Component Value Date/Time   ETH <10 01/14/2017 2032    IMAGING   MRI -   The study was reviewed via telephone with Dr. Garlan Fillers on 01/15/2017 at 1:30 p.m. Additional provided history includes metastatic lung cancer with radiation treatment for brain metastases performed elsewhere. The above described cerebral white matter T2 hyperintensities may reflect a combination of chronic small vessel ischemia and post treatment change. Additionally, some of the punctate foci of diffusion signal abnormality and microhemorrhage  could reflect treated metastases rather than ischemia. Comparison with prior outside imaging is suggested, and follow-up postcontrast brain MRI could be considered if this would change the patient's immediate clinical management.    Ct Angio Head W Or Wo Contrast 01/14/2017 IMPRESSION:  CTA NECK:  1. Atherosclerosis without hemodynamically significant stenosis or acute vascular process.  2. Small LEFT pleural effusion.  Recommend chest radiograph.   CTA HEAD:  1. No emergent vessel occlusion.  2. Intracranial atherosclerosis resulting in moderate to severe stenoses bilateral posterior cerebral artery's, severe  stenosis bilateral mid to distal middle cerebral artery's.    Ct Head Code Stroke Wo Contrast 01/14/2017 IMPRESSION:  1. Motion artifact, but no acute intracranial hemorrhage or cortically based acute infarct identified.  2. ASPECTS is 10.  3. Chronic left PCA territory infarct. Cerebral white matter disease.     Transthoracic Echocardiogram - pending       PHYSICAL EXAM Vitals:   01/15/17 0733 01/15/17 0859 01/15/17 1100 01/15/17 1335  BP: (!) 178/89 (!) 184/86 (!) 200/80 (!) 152/64  Pulse:  75  78  Resp: 16 18  16   Temp: 98.7 F (37.1 C) 98.5 F (36.9 C)  98.6 F (37 C)  TempSrc: Oral Oral  Axillary  SpO2: 96% 97%  96%  Weight:      Height:      ]  Physical exam: Exam: Gen: NAD, opens eyes to voice and is alert                    CV: RRR, no MRG. No Carotid Bruits. No peripheral edema, warm, nontender Eyes: Conjunctivae clear without exudates or hemorrhage  Neuro: Detailed Neurologic Exam  Speech:    Speech is normal; fluent and spontaneous with normal comprehension.  Cognition:    The patient is oriented to person, place, and time;    Cranial Nerves:    The pupils are equal, round, and reactive to light. Extraocular movements are intact. Trigeminal sensation is intact and the muscles of mastication are normal. The face is symmetric. The palate elevates in the midline. Hearing intact. Voice is normal. Shoulder shrug is normal. The tongue has normal motion without fasciculations.   Coordination:    Normal finger to nose and heel to shin. Normal rapid alternating movements.   Motor Observation:    No asymmetry, no atrophy, and no involuntary movements noted.    Strength:    Patient is moving all extremities, she was recently given morphine so is slightly uncooperative     Sensation: intact to LT     Reflex Exam:   ASSESSMENT/PLAN Ms. Kelly Frost is a 83 y.o. female with history of hypertension, atrial fibrillation on Eliquis, and lung cancer with  metastatic disease to the brain and spine presenting with sudden onset of slurred speech, right-sided weakness, and a severe headache. She did not receive IV t-PA due to anticoagulation.  Possible Stroke:  Embolic strokes from Afib (in the setting of missed Eliquis) vs  chronic small vessel ischemia and post radiation treatment change  Resultant  resolving  CT head - no acute cortically based acute infarct identified. Chronic left PCA territory infarct.  MRI head - Embolic strokes from Afib (in the setting of missed Eliquis) vs  chronic small vessel ischemia and post radiation treatment change  CTA head: Intracranial atherosclerosis resulting in moderate to severe stenoses bilateral posterior cerebral artery's, severe stenosis bilateral mid to distal middle cerebral artery's  Carotid Doppler - CTA neck performed without hemodynamically  significant stenosis   2D Echo - will order  LDL - 95  HgbA1c - 5.3  VTE prophylaxis - SCDs DIET DYS 3 Room service appropriate? Yes; Fluid consistency: Thin  Eliquis (apixaban) daily prior to admission, now on aspirin 81 mg daily. Resume Eliquis.   Patient counseled to be compliant with her antithrombotic medications  Ongoing aggressive stroke risk factor management  Therapy recommendations:  Home health PT and OT recommended  Disposition: Pending  Hypertension  Stable  Permissive hypertension (OK if < 220/120) but gradually normalize in 5-7 days  Long-term BP goal normotensive  Hyperlipidemia  Home meds: No lipid lowering medications prior to admission  LDL 95, goal < 70  Add Lipitor 20 mg daily   Continue statin at discharge   Other Stroke Risk Factors  Advanced age  ETOH use, advised to drink no more than 1 drink per day.  Hx stroke/TIA  Family hx stroke (father)  Atrial Fibrillation on Eliquis PTA  Other Active Problems  Hypokalemia - 3.2 -> supplemented -> 3.7  Intracranial atherosclerosis  Small LEFT pleural  effusion. Chest radiograph recommended.  [facility] day # 1  Personally examined patient and images, performed neuro exam,assessment and plan as stated above.  I have personally obtained the history, evaluated lab date, reviewed imaging studies and agree with radiology interpretations.   We will continue to follow until workup complete, if pending tests unremarkable (echo) then neuro will sign off with recommendation to discharge on Eliquis with neuro outpatient follow-up within 4 weeks, need previous imaging for comparison and possible repeat MRI w/wo contrast and seizure evaluation. Recommend f/u with Dr. Erlinda Hong..   Sarina Ill, MD Stroke Neurology  To contact Stroke Continuity provider, please refer to [email or web address] After hours, contact General Neurology

## 2017-01-15 NOTE — Discharge Summary (Signed)
[facility] Discharge Summary  Patient name: Kelly Frost Medical record number: [id number] Date of birth: 1929/04/14 Age: 83 y.o. Gender: female Date of Admission: 01/14/2017  Date of Discharge: 01/16/2017 Admitting Physician: Lind Covert, MD  Primary Care Provider: System, Pcp Not In Consultants: Neurology  Indication for Hospitalization: R sided weakness, slurred speech concerning for stroke  Discharge Diagnoses/Problem List:  R sided weakness, slurred speech, r/o stroke, stable Afib, stable (in NSR during hospitalization) Headache, improved HTN, stable Hypokalemia, resolved Hip and leg pain, stable H/o colon cancer H/o lung cancer with mets, s/p radiation  Disposition: Home  Discharge Condition: Stable  Discharge Exam:  General: elderly female lying in bed, somnolent but arousable Cardiovascular: RRR, systolic murmur Respiratory: CTA bilaterally MSK:ROM grossly intact with limited exam due to sleepiness, strength 5/5 to U/LE bilaterally. No edema.  Neuro:Somnolent but arousable, speech slurred. Extraocular movements intact. Intact symmetric sensation to light touch of face and extremities bilaterally. Hearing grossly intact bilaterally. Tongue protrudes normally with no deviation. Shoulder shrug symmetric. R sided facial droop. Finger to nose slowed and neglecting R side.  Brief Hospital Course:  Kelly Frost is a 83 y.o. female who presented with R sided weakness and slurred speech a few hours prior to presentation. PMH is significant for Afib on Eliquis, HTN, lung cancer with mets to spine and brain, and h/o colon cancer. Recently completed radiation treatment to brain a month ago. Patient was watching TV the evening of 9/28 when her speech became slurred and daughter states she stumbled when she got up to walk. Patient has some R sided weakness at baseline and normally ambulates with cane so unsure how much marked from baseline. Pt denies  h/o stroke in the past although noted in her chart. Reports missing doses of Eliquis and Lisinopril periodically the last month due to severe persistent headache after radiation. In the ED, Code Stroke was called, CT head showed no acute intracranial hemorrhage with L occipital encephalomalacia likely 2/2 metastatic brain lesion s/p radiation. Patient not a candidate for TPA due to anticoagulation with Eliquis. CTA head/neck with no emergent vessel occlusion but severe stenosis noted in bilateral mid to distal middle cerebral arteries. Patient not endorsing chest pain but trops x3 elevated to 0.09, 0.09, 0.07. EKG NSR. BP 176/87 on admission.  Labs notable for K 3.2. UDS positive for opiates but takes Norco for hip pain. Risk stratification labs: A1C 5.3, TSH 1.854, LDL 95. MRI Brain showed chronic small vessel ischemic disease vs. posttreatment change with treated mets. Neurology followed throughout this admission and recommended restarting Eliquis once Brain MRI resulted with no acute stroke process, also recommended discharging with statin and outpatient follow up with Neurology. PT/OT evaluated and recommended HH PT/OT/SLP.   Issues for Follow Up:  1. Patient may need continued follow up with Speech Pathology as she continues to have slurred speech. However, passed swallow study so minimal concern for aspiration. 2. Consider outpatient ECHO unless has had performed before to rule-out PFO. 3. Medication changes: 1. Started Lipitor 20mg  2. Restarted Eliquis 5mg  daily 3. Ensure continued toleration and compliance with medications. 4. Recommend discharge on Eliquis with neuro outpatient follow-up within 6-8 weeks. Will need previous imaging for comparison and possible repeat MRI w/wo contrast outpatient.  Significant Procedures: None  Significant Labs and Imaging:   Recent Labs Lab 01/14/17 2032 01/14/17 2038 01/15/17 0629  WBC 6.7  --  7.8  HGB 13.3 13.3 12.5  HCT 39.8 39.0 38.5  PLT 166  --  [facility] Lab 01/14/17 2032 01/14/17 2038 01/15/17 0629 01/16/17 0642  NA 139 142 139 139  K 3.2* 3.2* 3.7 3.5  CL 104 102 108 102  CO2 26  --  23 28  GLUCOSE 108* 107* 119* 108*  BUN 16 19 11 14   CREATININE 0.80 0.80 0.65 0.72  CALCIUM 9.3  --  9.1 9.5  ALKPHOS 148*  --   --   --   AST 16  --   --   --   ALT 10*  --   --   --   ALBUMIN 4.1  --   --   --     MRI Brain 9/29 FINDINGS: Brain: There are small foci of cortical and subcortical restricted diffusion in the posterior left frontal lobe consistent with acute infarcts. A single tiny acute cortical infarct is present in the posterior right frontal lobe. A punctate acute or subacute infarct is questioned in the left cerebellum (series 3, image 13). There is also a punctate acute infarct at the lateral aspect of the left temporal occipital junction. Scattered chronic microhemorrhages are noted in the left frontal lobe, bilateral parietal lobes, and right occipital lobe. There is a small chronic PCA infarct in the left occipital lobe. Patchy T2 hyperintensities elsewhere in the cerebral white matter bilaterally are nonspecific but compatible with moderate chronic small vessel ischemic disease. There is moderate cerebral atrophy. Chronic lacunar infarcts are noted in the left centrum semiovale and along the lateral margin of the left thalamus. There is a tiny chronic infarct in the lateral right cerebellum. No mass, midline shift, or extra-axial fluid collection is identified. A partially empty sella is noted. Vascular: Major intracranial vascular flow voids are preserved. Skull and upper cervical spine: Unremarkable bone marrow signal. Sinuses/Orbits: Bilateral cataract extraction. Paranasal sinuses and mastoid air cells are clear. Other: None. IMPRESSION: 1. Small acute infarcts in the left greater than right posterior frontal lobes, left temporo occipital junction, and possibly left cerebellum. 2.  Moderate chronic small vessel ischemic disease. Chronic left occipital infarct.  CT Head Code Stroke 9/28 IMPRESSION:  1. Motion artifact, but no acute intracranial hemorrhage or cortically based acute infarct identified. 2. ASPECTS is 10.  3. Chronic left PCA territory infarct. Cerebral white matter disease.   CTA Head/Neck 9/28 IMPRESSION:  CTA NECK:  1. Atherosclerosis without hemodynamically significant stenosis or acute vascular process.  2. Small LEFT pleural effusion.  Recommend chest radiograph.  CTA HEAD:  1. No emergent vessel occlusion.  2. Intracranial atherosclerosis resulting in moderate to severe stenoses bilateral posterior cerebral artery's, severe stenosis bilateral mid to distal middle cerebral artery's.   Results/Tests Pending at Time of Discharge: None  Discharge Medications:  Allergies as of 01/16/2017      Reactions   Penicillins Shortness Of Breath, Rash      Medication List    TAKE these medications   atorvastatin 20 MG tablet Commonly known as:  LIPITOR Take 1 tablet (20 mg total) by mouth daily at 6 PM.   ELIQUIS 5 MG Tabs tablet Generic drug:  apixaban Take 5 mg by mouth 2 (two) times daily.   HYDROcodone-acetaminophen 5-325 MG tablet Commonly known as:  NORCO/VICODIN Take 0.5-1 tablets by mouth every 6 (six) hours as needed for moderate pain. What changed:  Another medication with the same name was removed. Continue taking this medication, and follow the directions you see here.   lisinopril 5 MG tablet Commonly known as:  PRINIVIL,ZESTRIL Take  5 mg by mouth daily.   PRESCRIPTION MEDICATION Take 1 tablet by mouth at bedtime. White tablet with a triangle and M   PRESERVISION AREDS 2 PO Take 1 capsule by mouth 2 (two) times daily.   propafenone 225 MG tablet Commonly known as:  RYTHMOL Take 225 mg by mouth 2 (two) times daily.   traZODone 50 MG tablet Commonly known as:  DESYREL Take 50 mg by mouth at bedtime.             Discharge Care Instructions        Start     Ordered   01/16/17 0000  atorvastatin (LIPITOR) 20 MG tablet  Daily-1800     01/16/17 1407   01/16/17 0000  Home Health  (Home health needs / face to face )    Question Answer Comment  To provide the following care/treatments PT   To provide the following care/treatments OT   To provide the following care/treatments SLP      01/16/17 1434   01/16/17 0000  Face-to-face encounter (required for Medicare/Medicaid patients)  (Home health needs / face to face )    Comments:  I Darci Needle certify that this patient is under my care and that I, or a nurse practitioner or physician's assistant working with me, had a face-to-face encounter that meets the physician face-to-face encounter requirements with this patient on 01/16/2017. The encounter with the patient was in whole, or in part for the following medical condition(s) which is the primary reason for home health care (List medical condition): CVA  Question Answer Comment  The encounter with the patient was in whole, or in part, for the following medical condition, which is the primary reason for home health care speech deficits and r-sided weakness after stroke   I certify that, based on my findings, the following services are medically necessary home health services Speech language pathology   I certify that, based on my findings, the following services are medically necessary home health services Physical therapy   Reason for Medically [place] Therapy- Gait Training, Transfer Training and Stair Training   My clinical findings support the need for the above services Unable to leave home safely without assistance and/or assistive device   Further, I certify that my clinical findings support that this patient is homebound due to: Unable to leave home safely without assistance      01/16/17 1434   01/16/17 0000  Increase activity slowly     01/16/17 1443   01/16/17  0000  Diet - low sodium heart healthy     01/16/17 1443      Discharge Instructions: Please refer to Patient Instructions section of EMR for full details.  Patient was counseled important signs and symptoms that should prompt return to medical care, changes in medications, dietary instructions, activity restrictions, and follow up appointments.   Follow-Up Appointments: Follow-up Information    Rosalin Hawking, MD. Schedule an appointment as soon as possible for a visit in 6 week(s).   Specialty:  Neurology Contact information: [address] [place] [address] [phone]        [facility] [place] Follow up.   Why:  HHPT/OT/SLP- arranged-they will call you to set up home visits Contact information: [place] [address] [address] [place], [place], DO 01/16/2017, 10:28 PM PGY-1, [place]

## 2017-01-15 NOTE — Evaluation (Signed)
Physical Therapy Evaluation Patient Details Name: Kelly Frost MRN: [id number] DOB: [birth year] Today's Date: 01/15/2017   History of Present Illness  Pt is an 83 y/o female admitted secondary to R sided weakness and slurred speech. MRI revealed small acute infarcts on L>R posterior frontal lobes and L temporo occipital junctions and possibly L cerebellum. PMH including but not limited to lung cancer with mets to brain, colon cancer, HTN and a-fib.  Clinical Impression  Pt presented supine in bed with HOB elevated, awake and willing to participate in therapy session. Pt's son-in-law present throughout session as well and reported that pt was previously a very independent woman and lived by herself. He stated that the pt used a SPC to ambulate. Pt limited this session secondary to lethargy, headache pain and elevated BP. Pt performed bed mobility with supervision and upon sitting at EOB, pt's BP was measured at 200/80 mmHg. Pt's RN present in room and aware. Pt returned to supine. PT will continue to follow pt acutely for mobility progression. Pt would continue to benefit from skilled physical therapy services at this time while admitted and after d/c to address the below listed limitations in order to improve overall safety and independence with functional mobility.     Follow Up Recommendations Home health PT;Supervision/Assistance - 24 hour    Equipment Recommendations  None recommended by PT    Recommendations for Other Services       Precautions / Restrictions Precautions Precautions: Fall Precaution Comments: watch BP Restrictions Weight Bearing Restrictions: No      Mobility  Bed Mobility Overal bed mobility: Needs Assistance Bed Mobility: Supine to Sit;Sit to Supine     Supine to sit: Supervision Sit to supine: Supervision   General bed mobility comments: increased time and effort, supervision for safety  Transfers                 General transfer comment: deferred  secondary to elevated BP in sitting  Ambulation/Gait                Stairs            Wheelchair Mobility    Modified Rankin (Stroke Patients Only) Modified Rankin (Stroke Patients Only) Pre-Morbid Rankin Score: Slight disability Modified Rankin: Moderately severe disability     Balance Overall balance assessment: Needs assistance Sitting-balance support: Feet supported Sitting balance-Leahy Scale: Fair Sitting balance - Comments: able to sit EOB with min guard for safety                                     Pertinent Vitals/Pain Pain Assessment: Faces Faces Pain Scale: Hurts little more Pain Location: headache Pain Descriptors / Indicators: Headache Pain Intervention(s): Monitored during session;Repositioned;RN gave pain meds during session    Home Living Family/patient expects to be discharged to:: Private residence Living Arrangements: Children Available Help at Discharge: Family;Available 24 hours/day Type of Home: House Home Access: Stairs to enter   [employer] of Steps: 3 Home Layout: One level Home Equipment: Walker - 2 wheels;Cane - single point;Tub bench      Prior Function Level of Independence: Independent with assistive device(s)         Comments: pt ambulated with use of SPC     Hand Dominance        Extremity/Trunk Assessment   Upper Extremity Assessment Upper Extremity Assessment: Defer to OT evaluation    Lower Extremity  Assessment Lower Extremity Assessment: Generalized weakness       Communication   Communication: Expressive difficulties  Cognition Arousal/Alertness: Lethargic Behavior During Therapy: Flat affect Overall Cognitive Status: Difficult to assess                                        General Comments General comments (skin integrity, edema, etc.): BP sitting = 200/80, further mobility deferred at this time and pt returned to lying back down in bed after RN  administered morphine via IV    Exercises     Assessment/Plan    PT Assessment Patient needs continued PT services  PT Problem List Decreased strength;Decreased activity tolerance;Decreased balance;Decreased mobility;Decreased coordination;Decreased knowledge of use of DME;Decreased safety awareness;Decreased knowledge of precautions;Cardiopulmonary status limiting activity;Pain       PT Treatment Interventions DME instruction;Gait training;Stair training;Functional mobility training;Therapeutic activities;Therapeutic exercise;Balance training;Neuromuscular re-education;Patient/family education    PT Goals (Current goals can be found in the Care Plan section)  Acute Rehab PT Goals Patient Stated Goal: none stated (pt with garbled speech throughout and very difficult to understand) PT Goal Formulation: With patient/family Time For Goal Achievement: 01/29/17 Potential to Achieve Goals: Fair    Frequency Min 4X/week   Barriers to discharge        Co-evaluation               AM-PAC PT "6 Clicks" Daily Activity  Outcome Measure Difficulty turning over in bed (including adjusting bedclothes, sheets and blankets)?: A Little Difficulty moving from lying on back to sitting on the side of the bed? : A Little Difficulty sitting down on and standing up from a chair with arms (e.g., wheelchair, bedside commode, etc,.)?: A Lot Help needed moving to and from a bed to chair (including a wheelchair)?: A Little Help needed walking in hospital room?: A Little Help needed climbing 3-5 steps with a railing? : A Lot 6 Click Score: 16    End of Session   Activity Tolerance: Treatment limited secondary to medical complications (Comment);Patient limited by pain;Patient limited by fatigue Patient left: in bed;with bed alarm set;with call bell/phone within reach;with family/visitor present;with nursing/sitter in room Nurse Communication: Mobility status PT Visit Diagnosis: Other abnormalities  of gait and mobility (R26.89);Pain Pain - part of body:  (head/headache)    Time: 5784-6962 PT Time Calculation (min) (ACUTE ONLY): 16 min   Charges:   PT Evaluation $PT Eval Moderate Complexity: 1 Mod     PT G Codes:        [place], PT, DPT [place] 01/15/2017, 1:26 PM

## 2017-01-15 NOTE — Progress Notes (Signed)
Family Medicine Teaching Service Daily Progress Note Intern Pager: [phone]  Patient name: Kelly Frost Medical record number: [id number] Date of birth: September 23, 1928 Age: 83 y.o. Gender: female  Primary Care Provider: System, Pcp Not In Consultants: Neurology Code Status: Full  Pt Overview and Major Events to Date:  9/28 - admitted for stroke w/u  Assessment and Plan: Kelly Frost is a 83 y.o. female presenting with R sided weakness and slurred speech since this evening. PMH is significant for Afib, HTN, lung cancer with mets to spine and brain, and h/o colon cancer.  R sided weakness, slurred speech, r/o stroke Last known well time around 7:40pm yesterday evening. Patient's daughter noticed slurred speech, gaze deviation and stumbling when patient got up from sitting to walk. No prior history of stroke in the past. Has been missing some days of her medication including Eliquis and Lisinopril due to severe headache after radiation. In the ED, Code Stroke was called, CT head showed no acute intracranial hemorrhage. CTA head/neck with no emergent vessel occlusion but severe stenosis noted in bilateral mid to distal middle cerebral arteries. Not a candidate for TPA. Patient not endorsing chest pain but trop x2 0.09. EKG NSR. UDS positive for opiates but takes Norco for hip pain.  Difficult to tell if R sided weakness is new as patient's daughter states she does have some R sided weakness at baseline and walks with a cane. However, R sided facial droop and slurred speech is new and present at the time of exam. Current known risk factors for stroke include HTN, afib, FH of stroke. CHADsVASC score 4.  Lipid panel completed but age too high to assess for ASCVD risk. TSH 1.854. A1C 5.3. CBC wnl. Neurology consulted in ED. - continuous cardiac monitoring  - Neurology following, appreciate recs  - Brain MRI without contrast  - trend trops  - BMP - neuro checks q4h - NPO pending swallow study - PT/OT/SLP recs   - SCDs - vitals per unit routine   Afib  On Eliquis at home. Has missed some doses over the last month due to fatigue and headaches from radiation treatment.  More recently, she has a caregiver who helps her with her medications and she therefore has been taking her medication over the past week or so. Exam in regular rhythm with normal rate. - continuous cardiac monitoring - holding eliquis until brain MRI  - ASA 325mg  - SCDs for anticoagulation   Headache Subacute in nature. Has been endorsing since finished radiation treatment a month ago. Debilitating in character. Patient is tearful and looks in pain on exam this morning 9/29. - Morphine 4mg  x1 - can consider changing when passes her swallow study.  HTN Allowing for permissive hypertension with parameters. On Lisinopril 5 mg daily at home, currently holding.   - holding home lisinopril - hydralazine PRN for SBP >210 - monitor blood pressures  Hypokalemia K 3.2 on admission.  - replete with IV KCl 64meq as she is NPO currently  - daily BMET   Hip and leg pain  Likely due to OA, has had for some time. Saw Ortho last week and received injection in her hip. Previously has also had steroid injections in her knee. Takes Norco PRN for pain. - Norco Q6 PRN  H/o Lung and Colon cancer  With known mets to brain and spine. S/p 10 day radiation treatment to brain about a month ago. Followed by Dr. Hezzie Bump at [place].    FEN/GI: NPO until swallow study, SLIV  Prophylaxis: SCDs   Disposition: continue inpatient management for stroke w/u  Subjective:  Pt endorsing significant headache of chronic nature and requesting pain medication.  Objective: Temp:  [98.3 F (36.8 C)-98.8 F (37.1 C)] 98.7 F (37.1 C) (09/29 0733) Pulse Rate:  [74-82] 82 (09/29 0540) Resp:  [14-16] 16 (09/29 0733) BP: (162-193)/(61-89) 178/89 (09/29 0733) SpO2:  [92 %-97 %] 96 % (09/29 0733) Weight:  [151 lb 8 oz (68.7 kg)] 151 lb 8 oz (68.7 kg) (09/28  2340)  Physical Exam: General: elderly female lying in bed, appears uncomfortable and in pain Cardiovascular: RRR, systolic murmur Respiratory: CTA bilaterally, no wheezes/crackles MSK: ROM grossly intact, strength 4/5 to LE bilaterally 5/5 to UE bilaterally. No edema.  Neuro: Alert and oriented, speech slurred. Extraocular movements intact.  Intact symmetric sensation to light touch of face and extremities bilaterally.  Hearing grossly intact bilaterally.  Tongue protrudes normally with no deviation.  Shoulder shrug symmetric. R sided facial droop. Finger to nose slowed but normal.  Laboratory:  Recent Labs Lab 01/14/17 2032 01/14/17 2038 01/15/17 0629  WBC 6.7  --  7.8  HGB 13.3 13.3 12.5  HCT 39.8 39.0 38.5  PLT 166  --  155    Recent Labs Lab 01/14/17 2032 01/14/17 2038  NA 139 142  K 3.2* 3.2*  CL 104 102  CO2 26  --   BUN 16 19  CREATININE 0.80 0.80  CALCIUM 9.3  --   PROT 6.3*  --   BILITOT 0.7  --   ALKPHOS 148*  --   ALT 10*  --   AST 16  --   GLUCOSE 108* 107*   EKG - NSR  Imaging/Diagnostic Tests: Ct Angio Head W Or Wo Contrast  Result Date: 01/14/2017 CLINICAL DATA:  Follow-up stroke. Slurred speech. History of hypertension and cancer. EXAM: CT ANGIOGRAPHY HEAD AND NECK TECHNIQUE: Multidetector CT imaging of the head and neck was performed using the standard protocol during bolus administration of intravenous contrast. Multiplanar CT image reconstructions and MIPs were obtained to evaluate the vascular anatomy. Carotid stenosis measurements (when applicable) are obtained utilizing NASCET criteria, using the distal internal carotid diameter as the denominator. CONTRAST:  50 cc Isovue 370 COMPARISON:  CT HEAD January 14, 2017 at 2038 hours FINDINGS: CTA NECK AORTIC ARCH: Normal appearance of the thoracic arch, mild calcific atherosclerosis The origins of the innominate, left Common carotid artery and subclavian artery are widely patent. RIGHT CAROTID SYSTEM:  Common carotid artery is widely patent, mild calcific atherosclerosis. Mild eccentric calcific atherosclerosis carotid bifurcation without hemodynamically significant stenosis by NASCET criteria. Normal appearance of the included internal carotid artery. LEFT CAROTID SYSTEM: Common carotid artery is widely patent, mild calcific atherosclerosis. Moderate eccentric calcific atherosclerosis carotid bifurcation without hemodynamically significant stenosis by NASCET criteria. Normal appearance of the included internal carotid artery. VERTEBRAL ARTERIES:Codominant vertebral artery's. Vertebral artery's are widely patent, mild extrinsic compression due to degenerative cervical spine. SKELETON: No acute osseous process though bone windows have not been submitted. Multilevel severe cervical spondylosis with facet fusion. OTHER NECK: Soft tissues of the neck are nonacute though, not tailored for evaluation. Subcentimeter RIGHT thyroid nodule, below size followup recommendation. UPPER CHEST: Small layering LEFT pleural effusion. Biapical pleuroparenchymal scarring. Calcified granuloma RIGHT lung apex. CTA HEAD ANTERIOR CIRCULATION: Patent cervical internal carotid arteries, petrous, cavernous and supra clinoid internal carotid arteries. Widely patent anterior communicating artery. Patent anterior and middle cerebral arteries. Severe stenosis RIGHT M3 segments. Moderate general middle cerebral artery luminal regularity. 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 No large vessel occlusion, contrast extravasation or aneurysm. POSTERIOR CIRCULATION: Patent vertebral arteries, vertebrobasilar junction and basilar artery, as well as main branch vessels. Mild stenosis LEFT P1 origin. Patent posterior cerebral arteries, moderate to severe tandem stenoses. Thready bilateral posterior communicating artery's present. No large vessel occlusion, contrast extravasation or aneurysm. VENOUS SINUSES: Major dural venous sinuses are patent  though not tailored for evaluation on this angiographic examination. ANATOMIC VARIANTS: None. DELAYED PHASE: Not performed. MIP images reviewed. IMPRESSION: CTA NECK: 1. Atherosclerosis without hemodynamically significant stenosis or acute vascular process. 2. Small LEFT pleural effusion.  Recommend chest radiograph. CTA HEAD: 1. No emergent vessel occlusion. 2. Intracranial atherosclerosis resulting in moderate to severe stenoses bilateral posterior cerebral artery's, severe stenosis bilateral mid to distal middle cerebral artery's. Electronically Signed   By: Elon Alas M.D.   On: 01/14/2017 21:26   Ct Angio Neck W Or Wo Contrast  Result Date: 01/14/2017 CLINICAL DATA:  Follow-up stroke. Slurred speech. History of hypertension and cancer. EXAM: CT ANGIOGRAPHY HEAD AND NECK TECHNIQUE: Multidetector CT imaging of the head and neck was performed using the standard protocol during bolus administration of intravenous contrast. Multiplanar CT image reconstructions and MIPs were obtained to evaluate the vascular anatomy. Carotid stenosis measurements (when applicable) are obtained utilizing NASCET criteria, using the distal internal carotid diameter as the denominator. CONTRAST:  50 cc Isovue 370 COMPARISON:  CT HEAD January 14, 2017 at 2038 hours FINDINGS: CTA NECK AORTIC ARCH: Normal appearance of the thoracic arch, mild calcific atherosclerosis The origins of the innominate, left Common carotid artery and subclavian artery are widely patent. RIGHT CAROTID SYSTEM: Common carotid artery is widely patent, mild calcific atherosclerosis. Mild eccentric calcific atherosclerosis carotid bifurcation without hemodynamically significant stenosis by NASCET criteria. Normal appearance of the included internal carotid artery. LEFT CAROTID SYSTEM: Common carotid artery is widely patent, mild calcific atherosclerosis. Moderate eccentric calcific atherosclerosis carotid bifurcation without hemodynamically significant  stenosis by NASCET criteria. Normal appearance of the included internal carotid artery. VERTEBRAL ARTERIES:Codominant vertebral artery's. Vertebral artery's are widely patent, mild extrinsic compression due to degenerative cervical spine. SKELETON: No acute osseous process though bone windows have not been submitted. Multilevel severe cervical spondylosis with facet fusion. OTHER NECK: Soft tissues of the neck are nonacute though, not tailored for evaluation. Subcentimeter RIGHT thyroid nodule, below size followup recommendation. UPPER CHEST: Small layering LEFT pleural effusion. Biapical pleuroparenchymal scarring. Calcified granuloma RIGHT lung apex. CTA HEAD ANTERIOR CIRCULATION: Patent cervical internal carotid arteries, petrous, cavernous and supra clinoid internal carotid arteries. Widely patent anterior communicating artery. Patent anterior and middle cerebral arteries. Severe stenosis RIGHT M3 segments. Moderate general middle cerebral artery luminal regularity. Dolichoectasia seen with chronic hypertension. No large vessel occlusion, contrast extravasation or aneurysm. POSTERIOR CIRCULATION: Patent vertebral arteries, vertebrobasilar junction and basilar artery, as well as main branch vessels. Mild stenosis LEFT P1 origin. Patent posterior cerebral arteries, moderate to severe tandem stenoses. Thready bilateral posterior communicating artery's present. No large vessel occlusion, contrast extravasation or aneurysm. VENOUS SINUSES: Major dural venous sinuses are patent though not tailored for evaluation on this angiographic examination. ANATOMIC VARIANTS: None. DELAYED PHASE: Not performed. MIP images reviewed. IMPRESSION: CTA NECK: 1. Atherosclerosis without hemodynamically significant stenosis or acute vascular process. 2. Small LEFT pleural effusion.  Recommend chest radiograph. CTA HEAD: 1. No emergent vessel occlusion. 2. Intracranial atherosclerosis resulting in moderate to severe stenoses bilateral  posterior cerebral artery's, severe stenosis bilateral mid to distal middle cerebral artery's. Electronically Signed   By: Sandie Ano  Bloomer M.D.   On: 01/14/2017 21:26   Ct Head Code Stroke Wo Contrast  Result Date: 01/14/2017 CLINICAL DATA:  Code stroke. 83 year old female last seen normal 1830 hours. Slurred speech. EXAM: CT HEAD WITHOUT CONTRAST TECHNIQUE: Contiguous axial images were obtained from the base of the skull through the vertex without intravenous contrast. COMPARISON:  None. FINDINGS: Brain: No midline shift, mass effect, or evidence of intracranial mass lesion. Ventricle size and cerebral volume within normal limits for age. Mild motion artifact. No acute intracranial hemorrhage identified. Patchy and confluent bilateral cerebral white matter hypodensity. Chronic encephalomalacia in the left occipital pole. No definite No cortically based acute infarct identified. Vascular: No suspicious intracranial vascular hyperdensity. Intracranial artery dolichoectasia. Skull: Nonspecific confluent sclerosis along the anterior and left C1 ring. Visible bone mineralization elsewhere is within normal limits. Mild motion artifact. Sinuses/Orbits: Clear. Other: No acute orbit or scalp soft tissue findings. ASPECTS [facility] Stroke Program Early CT Score) - Ganglionic level infarction (caudate, lentiform nuclei, internal capsule, insula, M1-M3 cortex): 7 - Supraganglionic infarction (M4-M6 cortex): 3 Total score (0-10 with 10 being normal): 10 IMPRESSION: 1. Motion artifact, but no acute intracranial hemorrhage or cortically based acute infarct identified. 2. ASPECTS is 10. 3. Chronic left PCA territory infarct. Cerebral white matter disease. 4. The above was relayed via text pager to S. Aroor on 01/14/2017 at 20:45 . Electronically Signed   By: Genevie Ann M.D.   On: 01/14/2017 20:46    Rory Percy, DO 01/15/2017, 8:10 AM PGY-1, [place] Intern pager: [phone], text pages welcome

## 2017-01-15 NOTE — Progress Notes (Signed)
Pt admitted from ED  With rt sided weakness and facial droop/ slurred speech accompanied by daughter and Son in [employer]. Oriented to staff and room  Cardiac monitor applied  And CCMT notify to verify call bell within reach bed in lw position RN will continue to monitor.

## 2017-01-15 NOTE — Evaluation (Signed)
Occupational Therapy Evaluation Patient Details Name: Kelly Frost MRN: [id number] DOB: 04/04/1929 Today's Date: 01/15/2017    History of Present Illness Pt is an 83 y/o female admitted secondary to R sided weakness and slurred speech. MRI revealed small acute infarcts on L>R posterior frontal lobes and L temporo occipital junctions and possibly L cerebellum. PMH including but not limited to lung cancer with mets to brain, colon cancer, HTN and a-fib.   Clinical Impression   PTA, pt was living alone and recently been receiving help for ADLs from a caregiver. Pt daughter present throughout session and provided home set up and PLOF. Currently, pt functional performance limited by lethargy and BP with activity. Pt BP sitting at EOB 183/96 and after single sit<>stand BP elevated to 200/98; return pt to supine in bed and notified RN. Pt would benefit from further acute OT to facilitate safe dc. Pt may require higher level of therapy and may benefit from CIR once her activity tolerance increases. Recommend dc home with 24 hour supervision/assistance and HHOT to optimize pt safety and independence with ADLs and functional mobility.     Follow Up Recommendations  Home health OT;Supervision/Assistance - 24 hour    Equipment Recommendations  None recommended by OT    Recommendations for Other Services PT consult     Precautions / Restrictions Precautions Precautions: Fall Precaution Comments: watch BP Restrictions Weight Bearing Restrictions: No      Mobility Bed Mobility Overal bed mobility: Needs Assistance Bed Mobility: Supine to Sit;Sit to Supine     Supine to sit: Supervision Sit to supine: Supervision   General bed mobility comments: increased time and effort, supervision for safety  Transfers Overall transfer level: Needs assistance Equipment used: Rolling walker (2 wheeled) Transfers: Sit to/from Stand Sit to Stand: Min guard         General transfer comment: Min Guard for  safety    Balance Overall balance assessment: Needs assistance Sitting-balance support: Feet supported Sitting balance-Leahy Scale: Fair Sitting balance - Comments: able to sit EOB with min guard for safety   Standing balance support: During functional activity;Single extremity supported Standing balance-Leahy Scale: Fair                             ADL either performed or assessed with clinical judgement   ADL Overall ADL's : Needs assistance/impaired                                       General ADL Comments: Limited evaluation due to lethargy and elevated BP. Sitting EOB BP 183/96. After standing once and sitting back at EOB: BP 200/98. When standing, pt presenting with R inattention as seen by placing L hand on RW and tries to place R hand in middle of RW.      Vision   Vision Assessment?: Vision impaired- to be further tested in functional context Additional Comments: Pt reaching to Rside of RW at midline with R hand. Possible decreased attention to R side. However, difficult to assess due to lethargy.      Perception     Praxis      Pertinent Vitals/Pain Pain Assessment: Faces Faces Pain Scale: Hurts little more Pain Location: headache Pain Descriptors / Indicators: Headache Pain Intervention(s): Monitored during session;Limited activity within patient's tolerance;Repositioned     Hand Dominance Right   Extremity/Trunk Assessment Upper  Extremity Assessment Upper Extremity Assessment: Difficult to assess due to impaired cognition   Lower Extremity Assessment Lower Extremity Assessment: Defer to PT evaluation       Communication Communication Communication: Expressive difficulties   Cognition Arousal/Alertness: Lethargic;Suspect due to medications Behavior During Therapy: Flat affect Overall Cognitive Status: Difficult to assess                                 General Comments: Pt with difficulty follow simple  commands and maintaining arousal. Pt very lethargic throughout session suspected due to pain medication. Pt's daughter reports that she is very effected by pain medication.  Pt unable to state where she was or why she was in the hosital   General Comments  Daughter present throughout session and provided PLOF and home set up. BP sitting 183/96. BP after standing once 200/98. Pt returned to supine and defer further mobility. RN notified.     Exercises     Shoulder Instructions      Home Living Family/patient expects to be discharged to:: Private residence Living Arrangements: Children Available Help at Discharge: Family;Available 24 hours/day Type of Home: House Home Access: Stairs to enter [employer] of Steps: 3   Home Layout: One level     Bathroom Shower/Tub: Teacher, early years/pre: Standard (BSC over)     Home Equipment: Walker - 2 wheels;Cane - single point;Tub bench;Bedside commode          Prior Functioning/Environment Level of Independence: Independent with assistive device(s)        Comments: Pt was performing ADLs, IADLs, and driving prior to 6 weeks ago. Pt has been needing assistance for ADLs from a caregiver due to severe headaches.         OT Problem List: Decreased strength;Decreased range of motion;Decreased activity tolerance;Impaired balance (sitting and/or standing);Decreased cognition;Decreased safety awareness;Decreased coordination;Impaired vision/perception;Decreased knowledge of use of DME or AE;Decreased knowledge of precautions;Pain      OT Treatment/Interventions: Self-care/ADL training;Therapeutic exercise;Energy conservation;DME and/or AE instruction;Therapeutic activities;Patient/family education    OT Goals(Current goals can be found in the care plan section) Acute Rehab OT Goals Patient Stated Goal: none stated  OT Goal Formulation: With patient Time For Goal Achievement: 01/29/17 Potential to Achieve Goals:  Good ADL Goals Pt Will Perform Grooming: with min guard assist;standing Pt Will Perform Upper Body Dressing: with supervision;with set-up;sitting Pt Will Perform Lower Body Dressing: with min guard assist;with adaptive equipment;sit to/from stand Pt Will Transfer to Toilet: with min guard assist;bedside commode;ambulating Pt Will Perform Toileting - Clothing Manipulation and hygiene: with min guard assist;sit to/from stand  OT Frequency: Min 3X/week   Barriers to D/C:            Co-evaluation              AM-PAC PT "6 Clicks" Daily Activity     Outcome Measure Help from another person eating meals?: A Lot Help from another person taking care of personal grooming?: A Lot Help from another person toileting, which includes using toliet, bedpan, or urinal?: A Lot Help from another person bathing (including washing, rinsing, drying)?: A Lot Help from another person to put on and taking off regular upper body clothing?: A Lot Help from another person to put on and taking off regular lower body clothing?: A Lot 6 Click Score: 12   End of Session Equipment Utilized During Treatment: Gait belt;Rolling walker Nurse Communication: Mobility status;Other (  comment) (BP )  Activity Tolerance: Patient limited by lethargy Patient left: in bed;with bed alarm set;with family/visitor present  OT Visit Diagnosis: Unsteadiness on feet (R26.81);Other abnormalities of gait and mobility (R26.89);Muscle weakness (generalized) (M62.81);Pain;Hemiplegia and hemiparesis;Other symptoms and signs involving cognitive function Hemiplegia - Right/Left: Right Hemiplegia - dominant/non-dominant: Dominant Hemiplegia - caused by: Cerebral infarction Pain - Right/Left:  (Head ache) Pain - part of body:  (Head)                Time: 8676-7209 OT Time Calculation (min): 19 min Charges:  OT General Charges $OT Visit: 1 Visit OT Evaluation $OT Eval Moderate Complexity: 1 Mod G-Codes:     Dominick Morella MSOT,  OTR/L Acute Rehab Pager: [phone] Office: [place] 01/15/2017, 3:01 PM

## 2017-01-16 LAB — BASIC METABOLIC PANEL
ANION GAP: 9 (ref 5–15)
BUN: 14 mg/dL (ref 6–20)
CHLORIDE: 102 mmol/L (ref 101–111)
CO2: 28 mmol/L (ref 22–32)
Calcium: 9.5 mg/dL (ref 8.9–10.3)
Creatinine, Ser: 0.72 mg/dL (ref 0.44–1.00)
GFR calc non Af Amer: >60 mL/min (ref >60)
GLUCOSE: 108 mg/dL — AB (ref 65–99)
POTASSIUM: 3.5 mmol/L (ref 3.5–5.1)
Sodium: 139 mmol/L (ref 135–145)

## 2017-01-16 MED ORDER — ATORVASTATIN CALCIUM 20 MG PO TABS: 20.0000 mg | ORAL_TABLET | Freq: Every day | ORAL | 0 refills | Status: AC

## 2017-01-16 MED ORDER — ASPIRIN 81 MG PO TBEC: 81.0000 mg | DELAYED_RELEASE_TABLET | Freq: Every day | ORAL | 0 refills | Status: DC

## 2017-01-16 NOTE — Progress Notes (Signed)
STROKE TEAM PROGRESS NOTE   HISTORY OF PRESENT ILLNESS (per record) Kelly Frost is an 83 y.o. female white right-handed with past medical history of lung cancer with metastases to the brain and spine, atrial fibrillation and hypertension who presented as a stroke alert for sudden onset slurred speech and right-sided weakness.  Patient arrived around 8: 40 5 PM. She has slurred speech, right hemiparesis and right visual neglect. NIHSSwas 8. Initial blood pressure was 151 systolic. The EMS state the patient missed her morning dose of Eliquis however did take her nighttime dose which was confirmed later when the daughter arrived. CT head showed left occipital encephalomalacia likely from her metastatic brain lesion status post radiation. CTA  head showed no evidence of large vessel occlusion. It did slightly improve during her time in the emergency room.  The patient has been complaining of severe headache this radiation treatment and which has not responded to morphine.  Date last known well: 9.28.18 Time last known well: 7.30 pm  tPA Given: No, on Eliquis. Miss morning dose however took last night's dose NIHSS: 8 Baseline MRS 0   SUBJECTIVE (INTERVAL HISTORY) No family is at bedside, she is sitting in chair eating, alert and oriented to name and month not year, asks for nurse   OBJECTIVE Temp:  [98.4 F (36.9 C)-98.6 F (37 C)] 98.5 F (36.9 C) (09/30 0547) Pulse Rate:  [74-94] 86 (09/30 0547) Cardiac Rhythm: Normal sinus rhythm (09/30 0700) Resp:  [16-20] 20 (09/30 0547) BP: (152-200)/(64-110) 164/66 (09/30 0547) SpO2:  [94 %-97 %] 94 % (09/30 0547)  CBC:   Recent Labs Lab 01/14/17 2032 01/14/17 2038 01/15/17 0629  WBC 6.7  --  7.8  NEUTROABS 4.9  --   --   HGB 13.3 13.3 12.5  HCT 39.8 39.0 38.5  MCV 93.0  --  92.8  PLT 166  --  761    Basic Metabolic Panel:   Recent Labs Lab 01/15/17 0629 01/16/17 0642  NA 139 139  K 3.7 3.5  CL 108 102  CO2 23 28  GLUCOSE  119* 108*  BUN 11 14  CREATININE 0.65 0.72  CALCIUM 9.1 9.5    Lipid Panel:     Component Value Date/Time   CHOL 167 01/15/2017 0035   TRIG 89 01/15/2017 0035   HDL 54 01/15/2017 0035   CHOLHDL 3.1 01/15/2017 0035   VLDL 18 01/15/2017 0035   LDLCALC 95 01/15/2017 0035   HgbA1c:  Lab Results  Component Value Date   HGBA1C 5.3 01/15/2017   Urine Drug Screen:     Component Value Date/Time   LABOPIA POSITIVE (A) 01/14/2017 2128   COCAINSCRNUR NONE DETECTED 01/14/2017 2128   LABBENZ NONE DETECTED 01/14/2017 2128   AMPHETMU NONE DETECTED 01/14/2017 2128   THCU NONE DETECTED 01/14/2017 2128   LABBARB NONE DETECTED 01/14/2017 2128    Alcohol Level     Component Value Date/Time   ETH <10 01/14/2017 2032    IMAGING   MRI -   The study was reviewed via telephone with Dr. Garlan Fillers on 01/15/2017 at 1:30 p.m. Additional provided history includes metastatic lung cancer with radiation treatment for brain metastases performed elsewhere. The above described cerebral white matter T2 hyperintensities may reflect a combination of chronic small vessel ischemia and post treatment change. Additionally, some of the punctate foci of diffusion signal abnormality and microhemorrhage could reflect treated metastases rather than ischemia. Comparison with prior outside imaging is suggested, and follow-up postcontrast brain MRI could be considered  if this would change the patient's immediate clinical management.    Ct Angio Head W Or Wo Contrast 01/14/2017 IMPRESSION:  CTA NECK:  1. Atherosclerosis without hemodynamically significant stenosis or acute vascular process.  2. Small LEFT pleural effusion.  Recommend chest radiograph.   CTA HEAD:  1. No emergent vessel occlusion.  2. Intracranial atherosclerosis resulting in moderate to severe stenoses bilateral posterior cerebral artery's, severe stenosis bilateral mid to distal middle cerebral artery's.    Ct Head Code Stroke Wo  Contrast 01/14/2017 IMPRESSION:  1. Motion artifact, but no acute intracranial hemorrhage or cortically based acute infarct identified.  2. ASPECTS is 10.  3. Chronic left PCA territory infarct. Cerebral white matter disease.     Transthoracic Echocardiogram - pending       PHYSICAL EXAM Vitals:   01/15/17 1335 01/15/17 1655 01/15/17 2023 01/16/17 0547  BP: (!) 152/64 (!) 164/78 (!) 190/110 (!) 164/66  Pulse: 78 74 94 86  Resp: 16 18 20 20   Temp: 98.6 F (37 C) 98.4 F (36.9 C)  98.5 F (36.9 C)  TempSrc: Axillary Oral  Oral  SpO2: 96% 97% 94% 94%  Weight:      Height:      ]  Physical exam: Exam: Gen: NAD, sitting in chair                 CV: RRR, no MRG. No Carotid Bruits. No peripheral edema, warm, nontender Eyes: Conjunctivae clear without exudates or hemorrhage  Neuro: Detailed Neurologic Exam  Speech:    Speech is mildly dysarthric  Cognition:    The patient is oriented to person, month not year (1989)   Cranial Nerves:    The pupils are equal, round, and reactive to light. Extraocular movements are intact. Trigeminal sensation is intact and the muscles of mastication are normal. Right facial droop.. The palate elevates in the midline. Hearing intact. Voice is normal. Shoulder shrug is normal. The tongue has normal motion without fasciculations.   Coordination:    No dysmetria, FTN with mild tremor  Motor Observation:    Mild postural tremor    Strength:    Right-sided weakness 4-4+/5 more proximal than distal. Left hip flexion 4/5.     Sensation: intact to LT     Reflex Exam:   ASSESSMENT/PLAN Ms. Kelly Frost is a 83 y.o. female with history of hypertension, atrial fibrillation on Eliquis, and lung cancer with metastatic disease to the brain and spine presenting with sudden onset of slurred speech, right-sided weakness, and a severe headache. She did not receive IV t-PA due to anticoagulation.  Possible Stroke:  Embolic strokes from Afib (in the  setting of missed Eliquis) vs  chronic small vessel ischemia and post radiation treatment change  Resultant  resolving  CT head - no acute cortically based acute infarct identified. Chronic left PCA territory infarct.  MRI head - Embolic strokes from Afib (in the setting of missed Eliquis) vs  chronic small vessel ischemia and post radiation treatment change  CTA head: Intracranial atherosclerosis resulting in moderate to severe stenoses bilateral posterior cerebral artery's, severe stenosis bilateral mid to distal middle cerebral artery's  Carotid Doppler - CTA neck performed without hemodynamically significant stenosis   2D Echo - will order  LDL - 95  HgbA1c - 5.3  VTE prophylaxis - SCDs DIET DYS 3 Room service appropriate? Yes; Fluid consistency: Thin  Eliquis (apixaban) daily prior to admission, now on aspirin 81 mg daily. Resume Eliquis.   Patient counseled  to be compliant with her antithrombotic medications  Ongoing aggressive stroke risk factor management  Therapy recommendations:  Home health PT and OT recommended  Disposition: Pending  Hypertension  Stable  Permissive hypertension (OK if < 220/120) but gradually normalize in 5-7 days  Long-term BP goal normotensive  Hyperlipidemia  Home meds: No lipid lowering medications prior to admission  LDL 95, goal < 70  Add Lipitor 20 mg daily   Continue statin at discharge   Other Stroke Risk Factors  Advanced age  ETOH use, advised to drink no more than 1 drink per day.  Hx stroke/TIA  Family hx stroke (father)  Atrial Fibrillation on Eliquis PTA  Other Active Problems  Hypokalemia - 3.2 -> supplemented -> 3.7  Intracranial atherosclerosis  Small LEFT pleural effusion. Chest radiograph recommended.  [facility] day # 2  Personally examined patient and images, performed neuro exam,assessment and plan as stated above.  I have personally obtained the history, evaluated lab date,  reviewed imaging studies and agree with radiology interpretations.   Stroke will sign off at this , if pending tests unremarkable (echo) then recommend discharge on Eliquis with neuro outpatient follow-up within 6-8 weeks, need previous imaging for comparison and possible repeat MRI w/wo contrast outpatient. Recommend f/u with Dr. Erlinda Hong. Please re-consult if nedded.     To contact Stroke Continuity provider, please refer to [email or web address] After hours, contact General Neurology

## 2017-01-16 NOTE — Progress Notes (Signed)
Patient given discharge instructions with family at bedside.  All questions and concerns addressed.  Patient left unit by wheelchair accompanied by staff.

## 2017-01-16 NOTE — Progress Notes (Signed)
Occupational Therapy Treatment Patient Details Name: Kelly Frost MRN: [id number] DOB: 03-01-1929 Today's Date: 01/16/2017    History of present illness Pt is an 83 y/o female admitted secondary to R sided weakness and slurred speech. MRI revealed small acute infarcts on L>R posterior frontal lobes and L temporo occipital junctions and possibly L cerebellum. PMH including but not limited to lung cancer with mets to brain, colon cancer, HTN and a-fib.   OT comments   BPs. taken at beginning, middle, and end of session.  RN notified and agreeable to pt. Up in chair at end of session.  152/95 106 181/99 108 179/92 106  Pt. Able to amb. To/from b.room and perform pericare seated.  Max cues for [employer]. Eager to perform selfcare tasks independently. Will continue to follow acutely.     Follow Up Recommendations  Home health OT;Supervision/Assistance - 24 hour    Equipment Recommendations  None recommended by OT    Recommendations for Other Services PT consult    Precautions / Restrictions Precautions Precautions: Fall Precaution Comments: watch BP       Mobility Bed Mobility Overal bed mobility: Needs Assistance             General bed mobility comments: seated eob and in recliner beg./end of session  Transfers Overall transfer level: Needs assistance Equipment used: Rolling walker (2 wheeled) Transfers: Sit to/from [facility] Sit to Stand: Min assist Stand pivot transfers: Min assist            Balance                                           ADL either performed or assessed with clinical judgement   ADL Overall ADL's : Needs assistance/impaired     Grooming: Wash/dry hands;Set up                   Toilet Transfer: Minimal assistance;Ambulation;RW   Toileting- Clothing Manipulation and Hygiene: Min guard;Sitting/lateral lean       Functional mobility during ADLs: Minimal assistance;Rolling walker General  ADL Comments: RN aware of elevated BP but agreeable to allow pt. participation in skilled OT.       Vision       Perception     Praxis      Cognition Arousal/Alertness: Awake/alert Behavior During Therapy: Flat affect Overall Cognitive Status: Difficult to assess                                          Exercises     Shoulder Instructions       General Comments      Pertinent Vitals/ Pain       Pain Assessment: No/denies pain  Home Living                                          Prior Functioning/Environment              Frequency  Min 3X/week        Progress Toward Goals  OT Goals(current goals can now be found in the care plan section)  Progress towards OT goals: Progressing toward goals  Plan Discharge plan remains appropriate    Co-evaluation                 AM-PAC PT "6 Clicks" Daily Activity     Outcome Measure   Help from another person eating meals?: A Lot Help from another person taking care of personal grooming?: A Lot Help from another person toileting, which includes using toliet, bedpan, or urinal?: A Lot Help from another person bathing (including washing, rinsing, drying)?: A Lot Help from another person to put on and taking off regular upper body clothing?: A Lot Help from another person to put on and taking off regular lower body clothing?: A Lot 6 Click Score: 12    End of Session Equipment Utilized During Treatment: Gait belt;Rolling walker  OT Visit Diagnosis: Unsteadiness on feet (R26.81);Other abnormalities of gait and mobility (R26.89);Muscle weakness (generalized) (M62.81);Pain;Hemiplegia and hemiparesis;Other symptoms and signs involving cognitive function Hemiplegia - Right/Left: Right Hemiplegia - dominant/non-dominant: Dominant Hemiplegia - caused by: Cerebral infarction   Activity Tolerance Patient tolerated treatment well   Patient Left in chair;with call bell/phone  within reach;with nursing/sitter in room;with family/visitor present   Nurse Communication Other (comment) (reviewed BP readings recorded beg., middle, end of session.  also reported BP to nursing students who were present at end of session to assist pt. with bathing)        Time: 3094-0768 OT Time Calculation (min): 22 min  Charges: OT General Charges $OT Visit: 1 Visit OT Treatments $Self Care/Home Management : 8-22 mins   Janice Coffin, COTA/L 01/16/2017, 10:03 AM

## 2017-01-16 NOTE — Progress Notes (Signed)
Physical Therapy Treatment Patient Details Name: Kelly Frost MRN: [id number] DOB: 1928-08-28 Today's Date: 01/16/2017    History of Present Illness Pt is an 83 y/o female admitted secondary to R sided weakness and slurred speech. MRI revealed small acute infarcts on L>R posterior frontal lobes and L temporo occipital junctions and possibly L cerebellum. PMH including but not limited to lung cancer with mets to brain, colon cancer, HTN and a-fib.    PT Comments    Pt presents with much improved alertness and able to ambulate at level adequate for safe d/c home with family.  HHST highly recommended if not already ordered, and likely [place] as well.     Follow Up Recommendations  Home health PT;Supervision/Assistance - 24 hour (HHST for speech limitations)     Equipment Recommendations  None recommended by PT    Recommendations for Other Services       Precautions / Restrictions Precautions Precautions: Fall Precaution Comments: up with assist    Mobility  Bed Mobility Overal bed mobility: Needs Assistance             General bed mobility comments: up in/back to chair  Transfers Overall transfer level: Needs assistance Equipment used: Rolling walker (2 wheeled) Transfers: Sit to/from Stand Sit to Stand: Min guard Stand pivot transfers: Min assist       General transfer comment: rocking momentum to stand, able to achieve unassisted, standby for safety/stability without RW  Ambulation/Gait Ambulation/Gait assistance: Min guard Ambulation Distance (Feet): 100 Feet Assistive device: Rolling walker (2 wheeled) Gait Pattern/deviations: Step-through pattern;Shuffle;Narrow base of support;Trunk flexed;Drifts right/left   Gait velocity interpretation: Below normal speed for age/gender General Gait Details: slight drift/pull to wall on right, less notable when wall to left; slow and steady, turns unassisted without balance impairment; denies significant fatigue  after   Stairs            Wheelchair Mobility    Modified Rankin (Stroke Patients Only) Modified Rankin (Stroke Patients Only) Pre-Morbid Rankin Score: Slight disability Modified Rankin: Moderately severe disability     Balance Overall balance assessment: Needs assistance Sitting-balance support: Feet supported Sitting balance-Leahy Scale: Good Sitting balance - Comments: sits with trunk unsupported no LOB   Standing balance support: No upper extremity supported Standing balance-Leahy Scale: Fair Standing balance comment: safer to use RW; clings to furniture in room at beginning, more confident after Single Leg Stance - Right Leg: 3 Single Leg Stance - Left Leg: 8     Rhomberg - Eyes Opened: 10 Rhomberg - Eyes Closed: 10   High Level Balance Comments: pt demonstrates above average balance compared to similar peers; would benefit from additional instruction Standardized Balance Assessment Standardized Balance Assessment : Dynamic Gait Index   Dynamic Gait Index Level Surface: Moderate Impairment Change in Gait Speed: Mild Impairment Gait with Horizontal Head Turns: Normal Gait with Vertical Head Turns: Normal Gait and Pivot Turn: Mild Impairment Step Over Obstacle: Mild Impairment Step Around Obstacles: Normal Steps: Mild Impairment Total Score: 18      Cognition Arousal/Alertness: Awake/alert (napping on arrival, arouses without difficutly) Behavior During Therapy: WFL for tasks assessed/performed (more animated and smiling today) Overall Cognitive Status: Within Functional Limits for tasks assessed (appropriate, accurate, humorous)                                 General Comments: following all commands without delay, alert if a bit sleepy but denies significant fatigue  Exercises      General Comments        Pertinent Vitals/Pain Pain Assessment: No/denies pain    Home Living                      Prior Function             PT Goals (current goals can now be found in the care plan section) Acute Rehab PT Goals Patient Stated Goal: home Progress towards PT goals: Progressing toward goals    Frequency    Min 4X/week      PT Plan Current plan remains appropriate    Co-evaluation              AM-PAC PT "6 Clicks" Daily Activity  Outcome Measure  Difficulty turning over in bed (including adjusting bedclothes, sheets and blankets)?: A Little Difficulty moving from lying on back to sitting on the side of the bed? : A Little Difficulty sitting down on and standing up from a chair with arms (e.g., wheelchair, bedside commode, etc,.)?: A Little Help needed moving to and from a bed to chair (including a wheelchair)?: A Little Help needed walking in hospital room?: A Little Help needed climbing 3-5 steps with a railing? : A Little 6 Click Score: 18    End of Session Equipment Utilized During Treatment: Gait belt Activity Tolerance: Patient tolerated treatment well Patient left: in chair;with call bell/phone within reach;with family/visitor present Nurse Communication: Mobility status PT Visit Diagnosis: Hemiplegia and hemiparesis;Other abnormalities of gait and mobility (R26.89) Hemiplegia - Right/Left: Right Hemiplegia - dominant/non-dominant: Dominant Hemiplegia - caused by: Cerebral infarction     Time: 1125-1150 PT Time Calculation (min) (ACUTE ONLY): 25 min  Charges:  $Gait Training: 8-22 mins $Therapeutic Activity: 8-22 mins                    G Codes:       Kearney Hard, PT, DPT, MS Board Certified Geriatric Clinical Specialist    Herbie Drape 01/16/2017, 12:54 PM

## 2017-01-16 NOTE — Care Management Note (Signed)
Case Management Note Marvetta Gibbons RN, BSN Unit 4E-Case Manager-- 108M weekend coverage [phone]  Patient Details  Name: Temperence Zenor MRN: [id number] Date of Birth: 02-Jun-1928  Subjective/Objective:   Pt admitted with slurred speech, right-sided weakness and severeheadace                 Action/Plan: PTA pt lived at home with family- orders for [facility] placed- PT/OT/SLP- spoke with family at the bedside- choice offered for [facility] provider- per conversation pt has caregivers at home- and has had [facility] in the past- per family they would like to use [facility] again for [facility] services- no DME needs- pt has all needed DME- referral called to [facility] with [facility] for HHPT/OT/SLP-   Expected Discharge Date:  01/16/17               Expected Discharge Plan:  [place]  In-House Referral:  NA  Discharge planning Services  CM Consult  Post Acute Care Choice:  Home Health Choice offered to:  Patient, Adult Children  DME Arranged:    DME Agency:  NA  HH Arranged:  PT, OT, Speech Therapy [place] Agency:  [place]  Status of Service:  Completed, signed off  If discussed at [place] of Stay Meetings, dates discussed:    Discharge Disposition:  Home/home health   Additional Comments:  Dawayne Patricia, RN 01/16/2017, 3:34 PM

## 2017-01-16 NOTE — Progress Notes (Signed)
Family Medicine Teaching Service Daily Progress Note Intern Pager: [phone]  Patient name: Kelly Frost Medical record number: [id number] Date of birth: May 15, 1928 Age: 83 y.o. Gender: female  Primary Care Provider: System, Pcp Not In Consultants: Neurology Code Status: Full  Pt Overview and Major Events to Date:  9/28 - admitted for stroke w/u  Assessment and Plan: Kelly Frost is a 83 y.o. female presenting with R sided weakness and slurred speech since this evening. PMH is significant for Afib, HTN, lung cancer with mets to spine and brain, and h/o colon cancer.  R sided weakness, slurred speech, r/o stroke Patient presented with slurred speech, gaze deviation and h/o stumbling the evening of presentation after missing some doses of Eliquis and Lisinopril over the past month. Patient denies prior history of stroke however listed in chart. In the ED, Code Stroke was called, CT head showed no acute intracranial hemorrhage. CTA head/neck with no emergent vessel occlusion but severe stenosis noted in bilateral mid to distal middle cerebral arteries. Not a candidate for TPA. Patient not endorsing chest pain but trop 0.09, 0.09, 0.07, 0.07. EKG NSR. UDS positive for opiates but takes Norco for hip pain.  Difficult to tell if R sided weakness is new as patient's daughter states she does have some R sided weakness at baseline and walks with a cane. However, R sided facial droop and slurred speech is new and continues to be present at the time of exam. Current known risk factors for stroke include HTN, afib, FH of stroke. CHADsVASC score 4.  Lipid panel completed but age too high to assess for ASCVD risk. TSH 1.854. A1C 5.3. CBC wnl. recommend  Per Neuro, MRI brain with signal abnormality and microhemorrhages could reflect treated mets vs. Ischemia, recommend continuation of home Eliquis once MRI brain resulted with no acute stroke process, also recommended discharging with statin and Neuro f/u outpatient in 4  weeks. At that time will likely require repeat imaging and seizure evaluation per Neuro. PT/OT recommend [facility] PT/OT. Neuro exam this morning largely unchanged but neglecting R side with finger to nose exam. Could be due to somnolence but will need to reassess once no longer sleepy. To get ECHO today. Daughter wants to stay today due to feeling unwell, can likely go home tomorrow pending Neuro recs. - continuous cardiac monitoring  - Neurology following, appreciate recs  - neuro checks q4h - SCDs - vitals per unit routine  - ECHO  Afib  On Eliquis at home. Has missed some doses over the last month due to fatigue and headaches from radiation treatment.  More recently, she has a caregiver who helps her with her medications and she therefore has been taking her medication over the past week or so. Exam in regular rhythm with normal rate. - continuous cardiac monitoring - continue home Eliquis - ASA 325mg  - SCDs for anticoagulation   Headache Subacute in nature. Has been endorsing since finished radiation treatment a month ago. Debilitating in character. Required one dose of Morphine yesterday morning 9/29. No headache since then. - home Norco q6h PRN  HTN Allowing for permissive hypertension with parameters. On Lisinopril 5 mg daily at home, currently holding.   - holding home lisinopril - hydralazine PRN for SBP >210, has not needed - monitor blood pressures  Hypokalemia K 3.2 on admission. K this am 3.5 - daily BMET   Hip and leg pain  Likely due to OA, has had for some time. Saw Ortho last week and received injection in  her hip. Previously has also had steroid injections in her knee. Takes Norco PRN for pain. - Norco Q6 PRN  H/o Lung and Colon cancer  With known mets to brain and spine. S/p 10 day radiation treatment to brain about a month ago. Followed by Dr. Hezzie Bump at [place].    FEN/GI: Dysphagia 3 diet Prophylaxis: SCDs   Disposition: discharge to home after ECHO and  resolution of feeling unwell, pending Neuro recs  Subjective:  Pt quite somnolent on exam today. Daughter reports headache yesterday resolved with morphine but has been quite sleepy and jittery since then. One episode of nausea and vomiting yesterday and poor appetite throughout yesterday. Received zofran. Daughter wants to stay until patient feels better and mentioned getting records from [place].  Objective: Temp:  [98.4 F (36.9 C)-98.7 F (37.1 C)] 98.5 F (36.9 C) (09/30 0547) Pulse Rate:  [74-94] 86 (09/30 0547) Resp:  [16-20] 20 (09/30 0547) BP: (152-200)/(64-110) 164/66 (09/30 0547) SpO2:  [94 %-97 %] 94 % (09/30 0547)  Physical Exam: General: elderly female lying in bed, somnolent but arousable Cardiovascular: RRR, systolic murmur Respiratory: CTA bilaterally MSK: ROM grossly intact with limited exam due to sleepiness, strength 5/5 to U/LE bilaterally. No edema.  Neuro: Somnolent but arousable, speech slurred. Extraocular movements intact.  Intact symmetric sensation to light touch of face and extremities bilaterally.  Hearing grossly intact bilaterally.  Tongue protrudes normally with no deviation.  Shoulder shrug symmetric. R sided facial droop. Finger to nose slowed and neglecting R side.  Laboratory:  Recent Labs Lab 01/14/17 2032 01/14/17 2038 01/15/17 0629  WBC 6.7  --  7.8  HGB 13.3 13.3 12.5  HCT 39.8 39.0 38.5  PLT 166  --  155    Recent Labs Lab 01/14/17 2032 01/14/17 2038 01/15/17 0629  NA 139 142 139  K 3.2* 3.2* 3.7  CL 104 102 108  CO2 26  --  23  BUN 16 19 11   CREATININE 0.80 0.80 0.65  CALCIUM 9.3  --  9.1  PROT 6.3*  --   --   BILITOT 0.7  --   --   ALKPHOS 148*  --   --   ALT 10*  --   --   AST 16  --   --   GLUCOSE 108* 107* 119*   EKG - NSR  Imaging/Diagnostic Tests: Ct Angio Head W Or Wo Contrast  Result Date: 01/14/2017 CLINICAL DATA:  Follow-up stroke. Slurred speech. History of hypertension and cancer. EXAM: CT ANGIOGRAPHY  HEAD AND NECK TECHNIQUE: Multidetector CT imaging of the head and neck was performed using the standard protocol during bolus administration of intravenous contrast. Multiplanar CT image reconstructions and MIPs were obtained to evaluate the vascular anatomy. Carotid stenosis measurements (when applicable) are obtained utilizing NASCET criteria, using the distal internal carotid diameter as the denominator. CONTRAST:  50 cc Isovue 370 COMPARISON:  CT HEAD January 14, 2017 at 2038 hours FINDINGS: CTA NECK AORTIC ARCH: Normal appearance of the thoracic arch, mild calcific atherosclerosis The origins of the innominate, left Common carotid artery and subclavian artery are widely patent. RIGHT CAROTID SYSTEM: Common carotid artery is widely patent, mild calcific atherosclerosis. Mild eccentric calcific atherosclerosis carotid bifurcation without hemodynamically significant stenosis by NASCET criteria. Normal appearance of the included internal carotid artery. LEFT CAROTID SYSTEM: Common carotid artery is widely patent, mild calcific atherosclerosis. Moderate eccentric calcific atherosclerosis carotid bifurcation without hemodynamically significant stenosis by NASCET criteria. Normal appearance of the included internal carotid artery. VERTEBRAL  ARTERIES:Codominant vertebral artery's. Vertebral artery's are widely patent, mild extrinsic compression due to degenerative cervical spine. SKELETON: No acute osseous process though bone windows have not been submitted. Multilevel severe cervical spondylosis with facet fusion. OTHER NECK: Soft tissues of the neck are nonacute though, not tailored for evaluation. Subcentimeter RIGHT thyroid nodule, below size followup recommendation. UPPER CHEST: Small layering LEFT pleural effusion. Biapical pleuroparenchymal scarring. Calcified granuloma RIGHT lung apex. CTA HEAD ANTERIOR CIRCULATION: Patent cervical internal carotid arteries, petrous, cavernous and supra clinoid internal  carotid arteries. Widely patent anterior communicating artery. Patent anterior and middle cerebral arteries. Severe stenosis RIGHT M3 segments. Moderate general middle cerebral artery luminal regularity. Dolichoectasia seen with chronic hypertension. No large vessel occlusion, contrast extravasation or aneurysm. POSTERIOR CIRCULATION: Patent vertebral arteries, vertebrobasilar junction and basilar artery, as well as main branch vessels. Mild stenosis LEFT P1 origin. Patent posterior cerebral arteries, moderate to severe tandem stenoses. Thready bilateral posterior communicating artery's present. No large vessel occlusion, contrast extravasation or aneurysm. VENOUS SINUSES: Major dural venous sinuses are patent though not tailored for evaluation on this angiographic examination. ANATOMIC VARIANTS: None. DELAYED PHASE: Not performed. MIP images reviewed. IMPRESSION: CTA NECK: 1. Atherosclerosis without hemodynamically significant stenosis or acute vascular process. 2. Small LEFT pleural effusion.  Recommend chest radiograph. CTA HEAD: 1. No emergent vessel occlusion. 2. Intracranial atherosclerosis resulting in moderate to severe stenoses bilateral posterior cerebral artery's, severe stenosis bilateral mid to distal middle cerebral artery's. Electronically Signed   By: Elon Alas M.D.   On: 01/14/2017 21:26   Ct Angio Neck W Or Wo Contrast  Result Date: 01/14/2017 CLINICAL DATA:  Follow-up stroke. Slurred speech. History of hypertension and cancer. EXAM: CT ANGIOGRAPHY HEAD AND NECK TECHNIQUE: Multidetector CT imaging of the head and neck was performed using the standard protocol during bolus administration of intravenous contrast. Multiplanar CT image reconstructions and MIPs were obtained to evaluate the vascular anatomy. Carotid stenosis measurements (when applicable) are obtained utilizing NASCET criteria, using the distal internal carotid diameter as the denominator. CONTRAST:  50 cc Isovue 370  COMPARISON:  CT HEAD January 14, 2017 at 2038 hours FINDINGS: CTA NECK AORTIC ARCH: Normal appearance of the thoracic arch, mild calcific atherosclerosis The origins of the innominate, left Common carotid artery and subclavian artery are widely patent. RIGHT CAROTID SYSTEM: Common carotid artery is widely patent, mild calcific atherosclerosis. Mild eccentric calcific atherosclerosis carotid bifurcation without hemodynamically significant stenosis by NASCET criteria. Normal appearance of the included internal carotid artery. LEFT CAROTID SYSTEM: Common carotid artery is widely patent, mild calcific atherosclerosis. Moderate eccentric calcific atherosclerosis carotid bifurcation without hemodynamically significant stenosis by NASCET criteria. Normal appearance of the included internal carotid artery. VERTEBRAL ARTERIES:Codominant vertebral artery's. Vertebral artery's are widely patent, mild extrinsic compression due to degenerative cervical spine. SKELETON: No acute osseous process though bone windows have not been submitted. Multilevel severe cervical spondylosis with facet fusion. OTHER NECK: Soft tissues of the neck are nonacute though, not tailored for evaluation. Subcentimeter RIGHT thyroid nodule, below size followup recommendation. UPPER CHEST: Small layering LEFT pleural effusion. Biapical pleuroparenchymal scarring. Calcified granuloma RIGHT lung apex. CTA HEAD ANTERIOR CIRCULATION: Patent cervical internal carotid arteries, petrous, cavernous and supra clinoid internal carotid arteries. Widely patent anterior communicating artery. Patent anterior and middle cerebral arteries. Severe stenosis RIGHT M3 segments. Moderate general middle cerebral artery luminal regularity. Dolichoectasia seen with chronic hypertension. No large vessel occlusion, contrast extravasation or aneurysm. POSTERIOR CIRCULATION: Patent vertebral arteries, vertebrobasilar junction and basilar artery, as well as main branch vessels.  Mild stenosis LEFT P1 origin. Patent posterior cerebral arteries, moderate to severe tandem stenoses. Thready bilateral posterior communicating artery's present. No large vessel occlusion, contrast extravasation or aneurysm. VENOUS SINUSES: Major dural venous sinuses are patent though not tailored for evaluation on this angiographic examination. ANATOMIC VARIANTS: None. DELAYED PHASE: Not performed. MIP images reviewed. IMPRESSION: CTA NECK: 1. Atherosclerosis without hemodynamically significant stenosis or acute vascular process. 2. Small LEFT pleural effusion.  Recommend chest radiograph. CTA HEAD: 1. No emergent vessel occlusion. 2. Intracranial atherosclerosis resulting in moderate to severe stenoses bilateral posterior cerebral artery's, severe stenosis bilateral mid to distal middle cerebral artery's. Electronically Signed   By: Elon Alas M.D.   On: 01/14/2017 21:26   Mr Brain Wo Contrast  Addendum Date: 01/15/2017   ADDENDUM REPORT: 01/15/2017 13:41 ADDENDUM: The study was reviewed via telephone with Dr. Garlan Fillers on 01/15/2017 at 1:30 p.m. Additional provided history includes metastatic lung cancer with radiation treatment for brain metastases performed elsewhere. The above described cerebral white matter T2 hyperintensities may reflect a combination of chronic small vessel ischemia and post treatment change. Additionally, some of the punctate foci of diffusion signal abnormality and microhemorrhage could reflect treated metastases rather than ischemia. Comparison with prior outside imaging is suggested, and follow-up postcontrast brain MRI could be considered if this would change the patient's immediate clinical management. Electronically Signed   By: Logan Bores M.D.   On: 01/15/2017 13:41   Result Date: 01/15/2017 CLINICAL DATA:  Right-sided weakness and slurred speech. EXAM: MRI HEAD WITHOUT CONTRAST TECHNIQUE: Multiplanar, multiecho pulse sequences of the brain and surrounding structures  were obtained without intravenous contrast. COMPARISON:  Head CT and CTA 01/14/2017 FINDINGS: Brain: There are small foci of cortical and subcortical restricted diffusion in the posterior left frontal lobe consistent with acute infarcts. A single tiny acute cortical infarct is present in the posterior right frontal lobe. A punctate acute or subacute infarct is questioned in the left cerebellum (series 3, image 13). There is also a punctate acute infarct at the lateral aspect of the left temporal occipital junction. Scattered chronic microhemorrhages are noted in the left frontal lobe, bilateral parietal lobes, and right occipital lobe. There is a small chronic PCA infarct in the left occipital lobe. Patchy T2 hyperintensities elsewhere in the cerebral white matter bilaterally are nonspecific but compatible with moderate chronic small vessel ischemic disease. There is moderate cerebral atrophy. Chronic lacunar infarcts are noted in the left centrum semiovale and along the lateral margin of the left thalamus. There is a tiny chronic infarct in the lateral right cerebellum. No mass, midline shift, or extra-axial fluid collection is identified. A partially empty sella is noted. Vascular: Major intracranial vascular flow voids are preserved. Skull and upper cervical spine: Unremarkable bone marrow signal. Sinuses/Orbits: Bilateral cataract extraction. Paranasal sinuses and mastoid air cells are clear. Other: None. IMPRESSION: 1. Small acute infarcts in the left greater than right posterior frontal lobes, left temporo occipital junction, and possibly left cerebellum. 2. Moderate chronic small vessel ischemic disease. Chronic left occipital infarct. Electronically Signed: By: Logan Bores M.D. On: 01/15/2017 08:45   Ct Head Code Stroke Wo Contrast  Result Date: 01/14/2017 CLINICAL DATA:  Code stroke. 83 year old female last seen normal 1830 hours. Slurred speech. EXAM: CT HEAD WITHOUT CONTRAST TECHNIQUE: Contiguous  axial images were obtained from the base of the skull through the vertex without intravenous contrast. COMPARISON:  None. FINDINGS: Brain: No midline shift, mass effect, or evidence of intracranial mass lesion. Ventricle size and cerebral  volume within normal limits for age. Mild motion artifact. No acute intracranial hemorrhage identified. Patchy and confluent bilateral cerebral white matter hypodensity. Chronic encephalomalacia in the left occipital pole. No definite No cortically based acute infarct identified. Vascular: No suspicious intracranial vascular hyperdensity. Intracranial artery dolichoectasia. Skull: Nonspecific confluent sclerosis along the anterior and left C1 ring. Visible bone mineralization elsewhere is within normal limits. Mild motion artifact. Sinuses/Orbits: Clear. Other: No acute orbit or scalp soft tissue findings. ASPECTS [facility] Stroke Program Early CT Score) - Ganglionic level infarction (caudate, lentiform nuclei, internal capsule, insula, M1-M3 cortex): 7 - Supraganglionic infarction (M4-M6 cortex): 3 Total score (0-10 with 10 being normal): 10 IMPRESSION: 1. Motion artifact, but no acute intracranial hemorrhage or cortically based acute infarct identified. 2. ASPECTS is 10. 3. Chronic left PCA territory infarct. Cerebral white matter disease. 4. The above was relayed via text pager to S. Aroor on 01/14/2017 at 20:45 . Electronically Signed   By: Genevie Ann M.D.   On: 01/14/2017 20:46    Rory Percy, DO 01/16/2017, 6:25 AM PGY-1, [place] Intern pager: [phone], text pages welcome

## 2017-01-16 NOTE — Discharge Instructions (Signed)
Follow up in 1 week with your primary care physician. If you need to follow up at the family medicine clinic, our phone number is [phone]. Please also follow up with your neurologist in 6-8 weeks.

## 2017-01-17 ENCOUNTER — Other Ambulatory Visit (HOSPITAL_COMMUNITY): Payer: Medicare Other

## 2017-01-17 NOTE — ED Provider Notes (Signed)
[facility] DEPT Provider Note   CSN: [id number] Arrival date & time: 01/14/17  2030     History   Chief Complaint Chief Complaint  Patient presents with  . Code Stroke    HPI Kelly Frost is a 83 y.o. female.  HPI 83 y.o.female whopresented with R sided weakness and slurred speech prior to presentation. Pt has PMH is significant for Afib on Eliquis, HTN, lung cancer with mets to spine and brain, and h/o colon cancer. Recently completed radiation treatment to brain a month ago. Patient was watching TV the evening of 9/28 when her speech became slurred and daughter states she stumbled when she got up to walk. Pt denies h/o stroke in the past although noted in her chart. Reports missing doses of Eliquis and Lisinopril periodically the last month due to severe persistent headache after radiation.  Past Medical History:  Diagnosis Date  . Cancer ([place])   . Hypertension     Patient Active Problem List   Diagnosis Date Noted  . Acute ischemic stroke ([place]) 01/14/2017    Past Surgical History:  Procedure Laterality Date  . COLON SURGERY    . JOINT REPLACEMENT      OB History    No data available       Home Medications    Prior to Admission medications   Medication Sig Start Date End Date Taking? Authorizing Provider  apixaban (ELIQUIS) 5 MG TABS tablet Take 5 mg by mouth 2 (two) times daily.   Yes [provider]  HYDROcodone-acetaminophen (NORCO/VICODIN) 5-325 MG tablet Take 0.5-1 tablets by mouth every 6 (six) hours as needed for moderate pain.   Yes [provider]  lisinopril (PRINIVIL,ZESTRIL) 5 MG tablet Take 5 mg by mouth daily.   Yes [provider]  Multiple Vitamins-Minerals (PRESERVISION AREDS 2 PO) Take 1 capsule by mouth 2 (two) times daily.   Yes [provider]  PRESCRIPTION MEDICATION Take 1 tablet by mouth at bedtime. White tablet with a triangle and M   Yes [provider]  propafenone (RYTHMOL) 225 MG  tablet Take 225 mg by mouth 2 (two) times daily.   Yes [provider]  traZODone (DESYREL) 50 MG tablet Take 50 mg by mouth at bedtime.   Yes [provider]  atorvastatin (LIPITOR) 20 MG tablet Take 1 tablet (20 mg total) by mouth daily at 6 PM. 01/16/17   Shirley, [place], DO    Family History No family history on file.  Social History Social History  Substance Use Topics  . Smoking status: Never Smoker  . Smokeless tobacco: Never Used  . Alcohol use Yes     Allergies   Penicillins   Review of Systems Review of Systems  All other systems reviewed and are negative.    Physical Exam Updated Vital Signs BP (!) 155/84 (BP Location: Left Arm)   Pulse (!) 103   Temp 98.4 F (36.9 C) (Oral)   Resp 20   Ht 5\' 5"  (1.651 m)   Wt 68.7 kg (151 lb 8 oz)   SpO2 95%   BMI 25.21 kg/m   Physical Exam  Constitutional: She is oriented to person, place, and time. She appears well-developed.  HENT:  Head: Normocephalic and atraumatic.  Eyes: EOM are normal.  Neck: Normal range of motion. Neck supple.  Cardiovascular:  irregular  Pulmonary/Chest: Effort normal.  Abdominal: Bowel sounds are normal.  Neurological: She is alert and oriented to person, place, and time.  Slurred speech and subtle  R sided droop  Skin: Skin is warm and dry.  Nursing note and vitals reviewed.    ED Treatments / Results  Labs (all labs ordered are listed, but only abnormal results are displayed) Labs Reviewed  COMPREHENSIVE METABOLIC PANEL - Abnormal; Notable for the following:       Result Value   Potassium 3.2 (*)    Glucose, Bld 108 (*)    Total Protein 6.3 (*)    ALT 10 (*)    Alkaline Phosphatase 148 (*)    All other components within normal limits  RAPID URINE DRUG SCREEN, HOSP PERFORMED - Abnormal; Notable for the following:    Opiates POSITIVE (*)    All other components within normal limits  URINALYSIS, ROUTINE W REFLEX MICROSCOPIC - Abnormal; Notable for the  following:    Leukocytes, UA LARGE (*)    Bacteria, UA FEW (*)    Squamous Epithelial / LPF 0-5 (*)    All other components within normal limits  TROPONIN I - Abnormal; Notable for the following:    Troponin I 0.09 (*)    All other components within normal limits  TROPONIN I - Abnormal; Notable for the following:    Troponin I 0.07 (*)    All other components within normal limits  TROPONIN I - Abnormal; Notable for the following:    Troponin I 0.07 (*)    All other components within normal limits  BASIC METABOLIC PANEL - Abnormal; Notable for the following:    Glucose, Bld 119 (*)    All other components within normal limits  GLUCOSE, CAPILLARY - Abnormal; Notable for the following:    Glucose-Capillary 106 (*)    All other components within normal limits  BASIC METABOLIC PANEL - Abnormal; Notable for the following:    Glucose, Bld 108 (*)    All other components within normal limits  I-STAT CHEM 8, ED - Abnormal; Notable for the following:    Potassium 3.2 (*)    Glucose, Bld 107 (*)    Calcium, Ion 1.12 (*)    All other components within normal limits  I-STAT TROPONIN, ED - Abnormal; Notable for the following:    Troponin i, poc 0.09 (*)    All other components within normal limits  ETHANOL  PROTIME-INR  APTT  CBC  DIFFERENTIAL  HEMOGLOBIN A1C  TSH  CBC  LIPID PANEL    EKG  EKG Interpretation  Date/Time:  Saturday January 15 2017 04:24:48 EDT Ventricular Rate:  78 PR Interval:  176 QRS Duration: 90 QT Interval:  386 QTC Calculation: 440 R Axis:   41 Text Interpretation:  Normal sinus rhythm Nonspecific ST abnormality Abnormal ECG When compared to prior, no significant changes.  No STEMI  Confirmed by Antony Blackbird [phone]) on 01/16/2017 1:14:21 PM       Radiology Mr Brain Wo Contrast  Addendum Date: 01/15/2017   ADDENDUM REPORT: 01/15/2017 13:41 ADDENDUM: The study was reviewed via telephone with Dr. Garlan Fillers on 01/15/2017 at 1:30 p.m. Additional provided  history includes metastatic lung cancer with radiation treatment for brain metastases performed elsewhere. The above described cerebral white matter T2 hyperintensities may reflect a combination of chronic small vessel ischemia and post treatment change. Additionally, some of the punctate foci of diffusion signal abnormality and microhemorrhage could reflect treated metastases rather than ischemia. Comparison with prior outside imaging is suggested, and follow-up postcontrast brain MRI could be considered if this would change the patient's immediate clinical management. Electronically Signed   By: Zenia Resides  Jeralyn Ruths M.D.   On: 01/15/2017 13:41   Result Date: 01/15/2017 CLINICAL DATA:  Right-sided weakness and slurred speech. EXAM: MRI HEAD WITHOUT CONTRAST TECHNIQUE: Multiplanar, multiecho pulse sequences of the brain and surrounding structures were obtained without intravenous contrast. COMPARISON:  Head CT and CTA 01/14/2017 FINDINGS: Brain: There are small foci of cortical and subcortical restricted diffusion in the posterior left frontal lobe consistent with acute infarcts. A single tiny acute cortical infarct is present in the posterior right frontal lobe. A punctate acute or subacute infarct is questioned in the left cerebellum (series 3, image 13). There is also a punctate acute infarct at the lateral aspect of the left temporal occipital junction. Scattered chronic microhemorrhages are noted in the left frontal lobe, bilateral parietal lobes, and right occipital lobe. There is a small chronic PCA infarct in the left occipital lobe. Patchy T2 hyperintensities elsewhere in the cerebral white matter bilaterally are nonspecific but compatible with moderate chronic small vessel ischemic disease. There is moderate cerebral atrophy. Chronic lacunar infarcts are noted in the left centrum semiovale and along the lateral margin of the left thalamus. There is a tiny chronic infarct in the lateral right cerebellum. No mass,  midline shift, or extra-axial fluid collection is identified. A partially empty sella is noted. Vascular: Major intracranial vascular flow voids are preserved. Skull and upper cervical spine: Unremarkable bone marrow signal. Sinuses/Orbits: Bilateral cataract extraction. Paranasal sinuses and mastoid air cells are clear. Other: None. IMPRESSION: 1. Small acute infarcts in the left greater than right posterior frontal lobes, left temporo occipital junction, and possibly left cerebellum. 2. Moderate chronic small vessel ischemic disease. Chronic left occipital infarct. Electronically Signed: By: Logan Bores M.D. On: 01/15/2017 08:45    Procedures Procedures (including critical care time)  Medications Ordered in ED Medications  iopamidol (ISOVUE-370) 76 % injection (50 mLs  Contrast Given 01/14/17 2045)  potassium chloride 10 mEq in 100 mL IVPB (0 mEq Intravenous Stopped 01/15/17 0430)  morphine 4 MG/ML injection 4 mg (4 mg Intravenous Given 01/15/17 1136)     Initial Impression / Assessment and Plan / ED Course  I have reviewed the triage vital signs and the nursing notes.  Pertinent labs & imaging results that were available during my care of the patient were reviewed by me and considered in my medical decision making (see chart for details).     Pt has acute stroke. Not a TPA candidate due to brain mets and eliquis. CT head is neg. Will admit. Pt has improved since the onset of symptoms, especially with weakness.  Final Clinical Impressions(s) / ED Diagnoses   Final diagnoses:  Acute ischemic stroke [facility])    New Prescriptions Discharge Medication List as of 01/16/2017  4:55 PM    START taking these medications   Details  atorvastatin (LIPITOR) 20 MG tablet Take 1 tablet (20 mg total) by mouth daily at 6 PM., Starting Sun 01/16/2017, Normal         Varney Biles, MD 01/17/17 0022

## 2017-01-19 ENCOUNTER — Telehealth: Payer: Self-pay | Admitting: *Deleted

## 2017-01-19 NOTE — Telephone Encounter (Signed)
Ebony Hail, Speech Pathologist with [facility] left message on nurse line requesting orders from Dr. Erin Hearing to visit patient twice a week for one week then once a week for two weeks.   Patient does not have PCP list. Please advise. Ebony Hail number [phone].  Derl Barrow, RN

## 2017-01-20 NOTE — Telephone Encounter (Signed)
Left voice message for Ebony Hail verbal order given per Dr. Erin Hearing. Any further orders should be contacted by PCP or neurologist. Derl Barrow, RN

## 2017-01-20 NOTE — Telephone Encounter (Signed)
Please give order as above Advise her that further orders will need to come from patients pcp or neurology  Thanks  [facility]

## 2017-02-07 ENCOUNTER — Telehealth: Payer: Self-pay | Admitting: Neurology

## 2017-02-07 NOTE — Telephone Encounter (Signed)
Kelly Frost [phone] request VO for PT and OT x 1 x 2. Dr Jaynee Eagles saw this pt while in the hospital on 01/16/17. Pt will be ready to transition to outpt in 2 weeks if orders could be faxed over to get that set up.  Pt has not been seen in clinic yet, she has f/u with Dr Erlinda Hong 11/14.

## 2017-02-08 NOTE — Telephone Encounter (Signed)
Called and LVM for Kelly Frost giving VO per AA,MD for PT/OTx1 for 2 weeks. Gave pt name/DPOB. Gave [facility] phone number if they needed anything further.

## 2017-02-08 NOTE — Telephone Encounter (Signed)
That is fine please order thanks

## 2017-02-22 ENCOUNTER — Telehealth: Payer: Self-pay | Admitting: *Deleted

## 2017-02-22 NOTE — Telephone Encounter (Signed)
OT plan of care paperwork signed & faxed to [facility]. Received a receipt of confirmation.

## 2017-03-02 ENCOUNTER — Ambulatory Visit: Payer: Medicare Other | Admitting: Neurology

## 2017-03-02 ENCOUNTER — Encounter: Payer: Self-pay | Admitting: Neurology

## 2017-03-02 ENCOUNTER — Encounter (INDEPENDENT_AMBULATORY_CARE_PROVIDER_SITE_OTHER): Payer: Self-pay

## 2017-03-02 VITALS — BP 178/73 | HR 75 | Ht 66.0 in | Wt 144.6 lb

## 2017-03-02 DIAGNOSIS — I10 Essential (primary) hypertension: Secondary | ICD-10-CM | POA: Diagnosis not present

## 2017-03-02 DIAGNOSIS — Z7901 Long term (current) use of anticoagulants: Secondary | ICD-10-CM | POA: Diagnosis not present

## 2017-03-02 DIAGNOSIS — I63412 Cerebral infarction due to embolism of left middle cerebral artery: Secondary | ICD-10-CM | POA: Diagnosis not present

## 2017-03-02 DIAGNOSIS — I482 Chronic atrial fibrillation, unspecified: Secondary | ICD-10-CM

## 2017-03-02 DIAGNOSIS — C50919 Malignant neoplasm of unspecified site of unspecified female breast: Secondary | ICD-10-CM | POA: Diagnosis not present

## 2017-03-02 NOTE — Patient Instructions (Addendum)
-   continue eliquis and liptor for stroke prevention - no missing doses - will order outpt PT/OT/speech - work hard with them - check BP at home and record and bring over to PCP for medication adjustment.  - Follow up with your primary care physician for stroke risk factor modification. Recommend maintain blood pressure goal <130/80, diabetes with hemoglobin A1c goal below 7.0% and lipids with LDL cholesterol goal below 70 mg/dL.  - follow up with oncology for cancer treatment - discuss with PCP and refer for cardiology and repeat heart ultrasound - follow up in 3 months.

## 2017-03-02 NOTE — Progress Notes (Signed)
STROKE NEUROLOGY FOLLOW UP NOTE  NAME: Kelly Frost DOB: 25-May-1928  REASON FOR VISIT: stroke follow up HISTORY FROM: pt and daughter and chart  Today we had the pleasure of seeing Kelly Frost in follow-up at our Neurology Clinic. Pt was accompanied by daughter.   History Summary Kelly Frost is a 83 y.o. female with history of hypertension, atrial fibrillation noncompliant with Eliquis, and lung cancer with metastatic disease to the brain and spine admitted on 01/14/17 for sudden onset of aphasia, slurred speech, right-sided weakness, and a severe headache. MRI showed left MCA punctate and patchy infarcts, left occipital encephalomalacia. The latter more likely her previous lesion of brain metastasis. CTA head and neck showed b/l PCAs and MCAs moderate to severe stenosis. LDL 95 and A1C 5.3. She was continued on eliquis and lipitor and d/c home with home PT/OT.  Interval History During the interval time, the patient has been doing better. Still has right facial droop and dysarthria. Able to walk without walker at home but for long distance or walk outside, she needs walker. Now compliant with meds, daughter is helping her with meds. Just finished home PT/OT/speech and recommend outpt therapy by them. BP not checking at home but today in clinic it was 178/73. On BP meds.   REVIEW OF SYSTEMS: Full 14 system review of systems performed and notable only for those listed below and in HPI above, all others are negative:  Constitutional:  Weight loss Cardiovascular:  Ear/Nose/Throat:   Skin:  Eyes:   Respiratory:  SOB Gastroitestinal:   Genitourinary:  Hematology/Lymphatic:   Endocrine:  Musculoskeletal:  Joint pain Allergy/Immunology:  Runny nose Neurological:  Confusion, weakness, slurry speech, tremor Psychiatric: decreased energy, change in appetite, disinterest in activities Sleep:   The following represents the patient's updated allergies and side effects list: Allergies  Allergen  Reactions  . Penicillins Shortness Of Breath and Rash    The neurologically relevant items on the patient's problem list were reviewed on today's visit.  Neurologic Examination  A problem focused neurological exam (12 or more points of the single system neurologic examination, vital signs counts as 1 point, cranial nerves count for 8 points) was performed.  Blood pressure (!) 178/73, pulse 75, height 5\' 6"  (1.676 m), weight 144 lb 9.6 oz (65.6 kg).  General - Well nourished, well developed, in no apparent distress.  Ophthalmologic - Fundi not visualized due to noncooperation.  Cardiovascular - irregularly irregular heart rate and rhythm  Mental Status -  Level of arousal and orientation to time, place, and person were intact. Language including expression, naming, repetition, comprehension was assessed and found intact. Fund of Knowledge was assessed and was intact.  Cranial Nerves II - XII - II - right hemianopia, RLQ>RUQ. III, IV, VI - Extraocular movements intact. V - Facial sensation intact bilaterally. VII - right facial droop. VIII - Hearing & vestibular intact bilaterally. X - Palate elevates symmetrically, mild dysarthria. XI - Chin turning & shoulder shrug intact bilaterally. XII - Tongue protrusion intact.  Motor Strength - The patient's strength was normal in all extremities except RLE 4/5 distal and proximal and pronator drift was absent.  Bulk was normal and fasciculations were absent.   Motor Tone - Muscle tone was assessed at the neck and appendages and was normal.  Reflexes - The patient's reflexes were 1+ in all extremities and she had no pathological reflexes.  Sensory - Light touch, temperature/pinprick were assessed and were normal.    Coordination - The patient  had normal movements in the hands with no ataxia or dysmetria.  Tremor was absent.  Gait and Station - walk with walker, but able to walk without device, slow and small stride.    Functional  score  mRS = 3   0 - No symptoms.   1 - No significant disability. Able to carry out all usual activities, despite some symptoms.   2 - Slight disability. Able to look after own affairs without assistance, but unable to carry out all previous activities.   3 - Moderate disability. Requires some help, but able to walk unassisted.   4 - Moderately severe disability. Unable to attend to own bodily needs without assistance, and unable to walk unassisted.   5 - Severe disability. Requires constant nursing care and attention, bedridden, incontinent.   6 - Dead.   NIH Stroke Scale   Level Of Consciousness 0=Alert; keenly responsive 1=Not alert, but arousable by minor stimulation 2=Not alert, requires repeated stimulation 3=Responds only with reflex movements 0  LOC Questions to Month and Age 61=Answers both questions correctly 1=Answers one question correctly 2=Answers neither question correctly 0  LOC Commands      -Open/Close eyes     -Open/close grip 0=Performs both tasks correctly 1=Performs one task correctly 2=Performs neighter task correctly 0  Best Gaze 0=Normal 1=Partial gaze palsy 2=Forced deviation, or total gaze paresis 0  Visual 0=No visual loss 1=Partial hemianopia 2=Complete hemianopia 3=Bilateral hemianopia (blind including cortical blindness) 2  Facial Palsy 0=Normal symmetrical movement 1=Minor paralysis (asymmetry) 2=Partial paralysis (lower face) 3=Complete paralysis (upper and lower face) 1  Motor  0=No drift, limb holds posture for full 10 seconds 1=Drift, limb holds posture, no drift to bed 2=Some antigravity effort, cannot maintain posture, drifts to bed 3=No effort against gravity, limb falls 4=No movement Right Arm 0     Leg 0    Left Arm 0     Leg 0  Limb Ataxia 0=Absent 1=Present in one limb 2=Present in two limbs 0  Sensory 0=Normal 1=Mild to moderate sensory loss 2=Severe to total sensory loss 0  Best Language 0=No aphasia,  normal 1=Mild to moderate aphasia 2=Mute, global aphasia 3=Mute, global aphasia 0  Dysarthria 0=Normal 1=Mild to moderate 2=Severe, unintelligible or mute/anarthric 1  Extinction/Neglect 0=No abnormality 1=Extinction to bilateral simultaneous stimulation 2=Profound neglect 0  Total   4     Data reviewed: I personally reviewed the images and agree with the radiology interpretations.  MRI brain -  1. Small acute infarcts in the left greater than right posterior frontal lobes, left temporo occipital junction, and possibly left cerebellum. 2. Moderate chronic small vessel ischemic disease. Chronic left occipital infarct.  Additional provided history includes metastatic lung cancer with radiation treatment for brain metastases performed elsewhere. The above described cerebral white matter T2 hyperintensities may reflect a combination of chronic small vessel ischemia and post treatment change. Additionally, some of the punctate foci of diffusion signal abnormality and microhemorrhage could reflect treated metastases rather than ischemia. Comparison with prior outside imaging is suggested, and follow-up postcontrast brain MRI could be considered if this would change the patient's immediate clinical management.  Ct Angio Head W Or Wo Contrast 01/14/2017 IMPRESSION:  CTA NECK:  1. Atherosclerosis without hemodynamically significant stenosis or acute vascular process.  2. Small LEFT pleural effusion.  Recommend chest radiograph.  CTA HEAD:  1. No emergent vessel occlusion.  2. Intracranial atherosclerosis resulting in moderate to severe stenoses bilateral posterior cerebral artery's, severe stenosis bilateral mid to distal  middle cerebral artery's.    Ct Head Code Stroke Wo Contrast 01/14/2017 IMPRESSION:  1. Motion artifact, but no acute intracranial hemorrhage or cortically based acute infarct identified.  2. ASPECTS is 10.  3. Chronic left PCA territory infarct. Cerebral  white matter disease.   Component     Latest Ref Rng & Units 01/15/2017  Cholesterol     0 - 200 mg/dL 167  Triglycerides     <150 mg/dL 89  HDL Cholesterol     >40 mg/dL 54  Total CHOL/HDL Ratio     RATIO 3.1  VLDL     0 - 40 mg/dL 18  LDL (calc)     0 - 99 mg/dL 95  Hemoglobin A1C     4.8 - 5.6 % 5.3  Mean Plasma Glucose     mg/dL 105.41  TSH     0.350 - 4.500 uIU/mL 1.854     Assessment: As you may recall, she is a 83 y.o. Caucasian female with PMH of hypertension, atrial fibrillation noncompliant with Eliquis, and lung cancer with metastatic disease to the brain and spine admitted on 01/14/17 for sudden onset of aphasia, slurred speech, right-sided weakness, and a severe headache. MRI showed left MCA punctate and patchy infarcts, left occipital encephalomalacia. The latter more likely her previous lesion of brain metastasis. CTA head and neck showed b/l PCAs and MCAs moderate to severe stenosis. LDL 95 and A1C 5.3. Stroke could be due to Afib noncompliance with AC vs. Hypercoagulable state 2/2 advanced malignancy. She was continued on eliquis and lipitor and d/c home with home PT/OT. Still has right facial droop and dysarthria. Just finished home PT/OT/speech and will do outpt therapy.  Plan:  - continue eliquis and liptor for stroke prevention - no missing doses - will order outpt PT/OT/speech - work hard with them - check BP at home and record and bring over to PCP for medication adjustment.  - Follow up with your primary care physician for stroke risk factor modification. Recommend maintain blood pressure goal <130/80, diabetes with hemoglobin A1c goal below 7.0% and lipids with LDL cholesterol goal below 70 mg/dL.  - follow up with oncology for cancer treatment - discuss with PCP and refer for cardiology and repeat heart ultrasound - follow up in 3 months.   I spent more than 25 minutes of face to face time with the patient. Greater than 50% of time was spent in counseling  and coordination of care. We discussed eliquis use, follow up with cardiology and oncology, check BP at home.    Orders Placed This Encounter  Procedures  . Ambulatory referral to Physical Therapy    Referral Priority:   Routine    Referral Type:   Physical Medicine    Referral Reason:   Specialty Services Required    Requested Specialty:   Physical Therapy    Number of Visits Requested:   1  . Ambulatory referral to Occupational Therapy    Referral Priority:   Routine    Referral Type:   Occupational Therapy    Referral Reason:   Specialty Services Required    Requested Specialty:   Occupational Therapy    Number of Visits Requested:   1  . Ambulatory referral to Speech Therapy    Referral Priority:   Routine    Referral Type:   Speech Therapy    Referral Reason:   Specialty Services Required    Requested Specialty:   Speech Pathology    Number of  Visits Requested:   1    Meds ordered this encounter  Medications  . metoprolol tartrate (LOPRESSOR) 50 MG tablet    Sig: Take 50 mg 2 (two) times daily by mouth.  . doxycycline (VIBRA-TABS) 100 MG tablet    Sig: Take 100 mg 2 (two) times daily by mouth.    Patient Instructions  - continue eliquis and liptor for stroke prevention - no missing doses - will order outpt PT/OT/speech - work hard with them - check BP at home and record and bring over to PCP for medication adjustment.  - Follow up with your primary care physician for stroke risk factor modification. Recommend maintain blood pressure goal <130/80, diabetes with hemoglobin A1c goal below 7.0% and lipids with LDL cholesterol goal below 70 mg/dL.  - follow up with oncology for cancer treatment - discuss with PCP and refer for cardiology and repeat heart ultrasound - follow up in 3 months.     Rosalin Hawking, MD PhD [facility] [address], [place], [place] [address] [phone]

## 2017-03-28 ENCOUNTER — Ambulatory Visit: Payer: Medicare Other | Admitting: Speech Pathology

## 2017-03-28 ENCOUNTER — Ambulatory Visit: Payer: Medicare Other | Admitting: Physical Therapy

## 2017-03-28 ENCOUNTER — Ambulatory Visit: Payer: Medicare Other | Admitting: Occupational Therapy

## 2017-05-02 ENCOUNTER — Telehealth: Payer: Self-pay | Admitting: *Deleted

## 2017-05-02 NOTE — Telephone Encounter (Signed)
Faxed signed OT plan of care paperwork to [facility]. Received a receipt of confirmation.

## 2017-06-06 ENCOUNTER — Ambulatory Visit: Payer: Medicare Other | Admitting: Nurse Practitioner

## 2017-07-26 ENCOUNTER — Telehealth: Payer: Self-pay

## 2017-07-26 NOTE — Telephone Encounter (Signed)
I called pt's daughter, Hinton Dyer, per [facility] [facility], to offer pt a sooner appt with Janett Billow, NP rather than the scheduled appt with Hoyle Sauer, NP on 08/01/17. No answer, VM is full. If pt calls back, please offer pt's daughter this option for a sooner appt with Janett Billow.

## 2017-08-01 ENCOUNTER — Ambulatory Visit: Payer: Medicare Other | Admitting: Nurse Practitioner

## 2017-09-17 DEATH — deceased

## 2018-03-08 IMAGING — MR MR HEAD W/O CM
8 of 10 series · 36 of 48 positions shown · IV contrast (agent unspecified)
Comparison: Head CT and CTA 01/14/2017

ADDENDUM:
The study was reviewed via telephone with Dr. Maghzaoui on 01/15/2017
at [DATE] p.m. Additional provided history includes metastatic lung
cancer with radiation treatment for brain metastases performed
elsewhere. The above described cerebral white matter T2
hyperintensities may reflect a combination of chronic small vessel
ischemia and post treatment change. Additionally, some of the
punctate foci of diffusion signal abnormality and microhemorrhage
could reflect treated metastases rather than ischemia. Comparison
with prior outside imaging is suggested, and follow-up postcontrast
brain MRI could be considered if this would change the patient's
immediate clinical management.
CLINICAL DATA: Right-sided weakness and slurred speech.

EXAM:
MRI HEAD WITHOUT CONTRAST
TECHNIQUE: Multiplanar, multiecho pulse sequences of the brain and surrounding
structures were obtained without intravenous contrast.

[Series 3: DWI · axial · 3.0mm · 1.09mm/px · z∈[-71,+74]mm · 9 of 100 slices shown (1 of 4)]
[im 1/100]
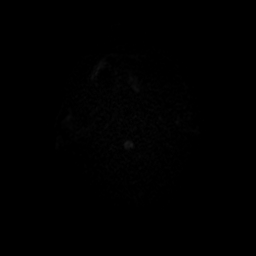
[im 13/100]
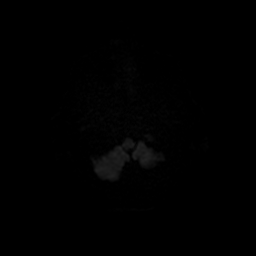
[im 25/100]
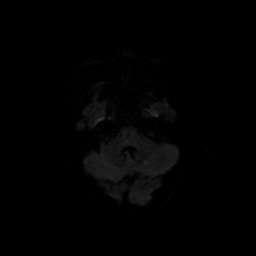
[im 38/100]
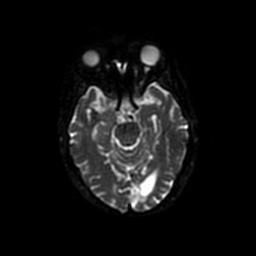
[im 50/100]
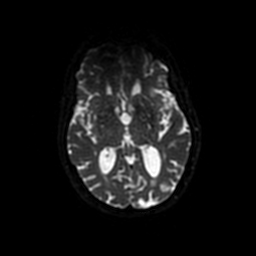
[im 62/100]
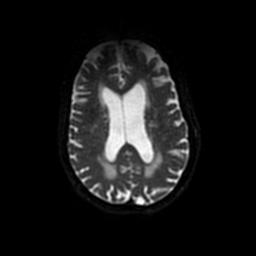
[im 75/100]
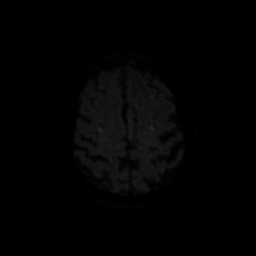
[im 87/100]
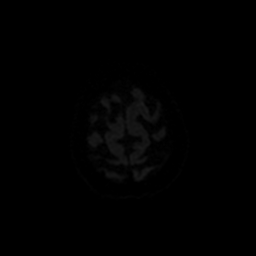
[im 100/100]
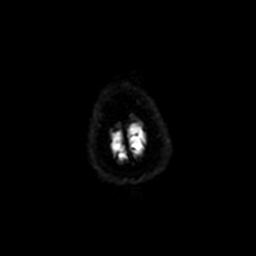

[Series 4: DWI · coronal · 5.0mm · 1.09mm/px · 8 of 88 slices shown (2 of 4)]
[im 1/88]
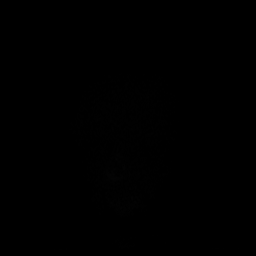
[im 13/88]
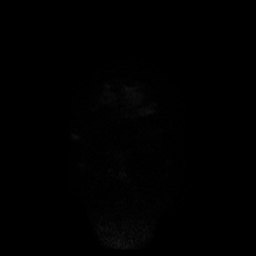
[im 25/88]
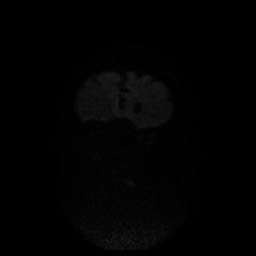
[im 38/88]
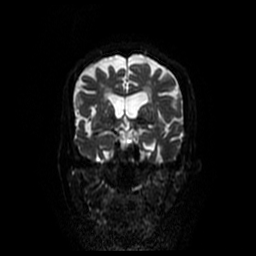
[im 50/88]
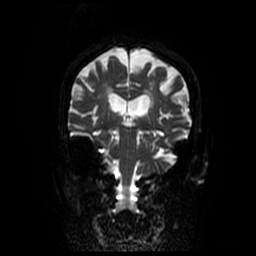
[im 63/88]
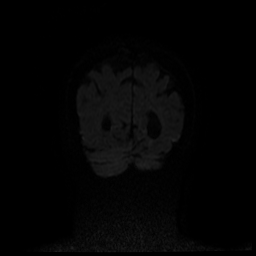
[im 75/88]
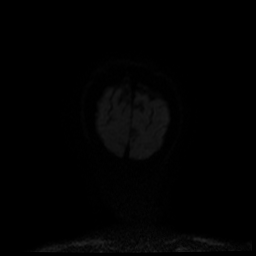
[im 88/88]
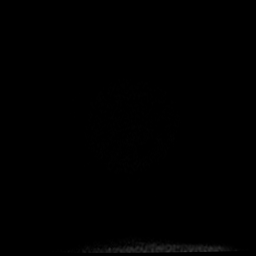

[Series 5: T1 · sagittal · 5.0mm · 0.47mm/px · 2 of 23 slices shown]
[im 1/23]
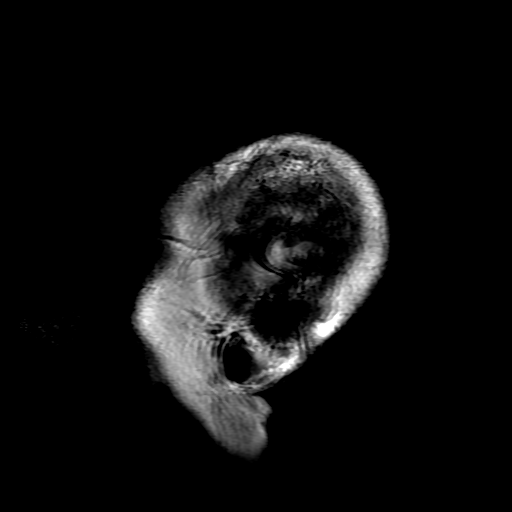
[im 23/23]
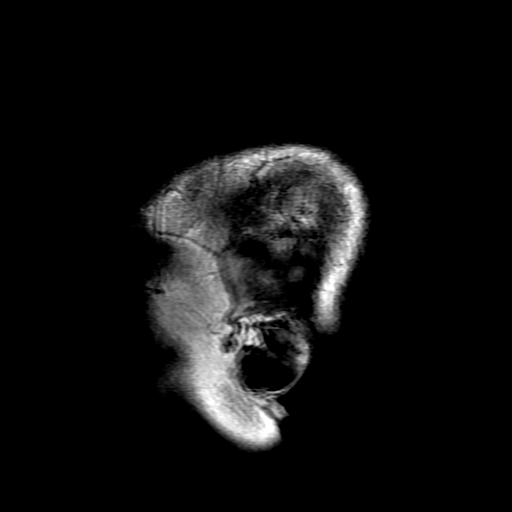

[Series 6: T2 · axial · 5.0mm · 0.43mm/px · z∈[-80,+74]mm · 3 of 27 slices shown]
[im 1/27]
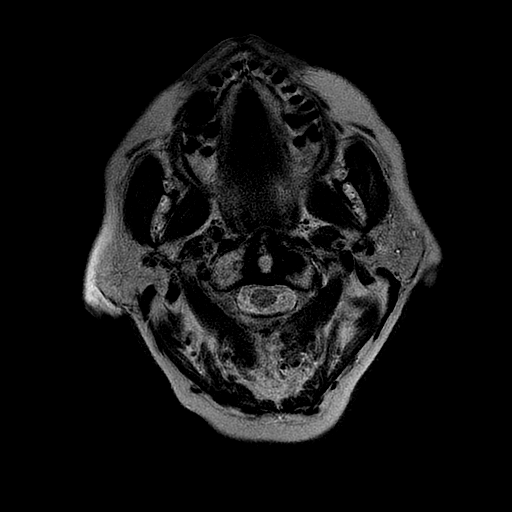
[im 14/27]
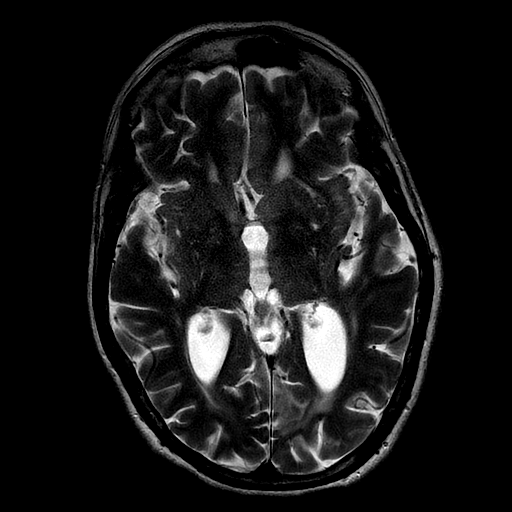
[im 27/27]
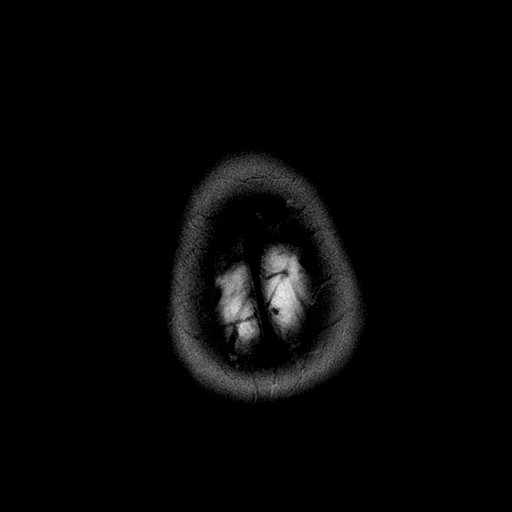

[Series 7: FLAIR · axial · 5.0mm · 0.43mm/px · z∈[-80,+74]mm · 3 of 27 slices shown]
[im 1/27]
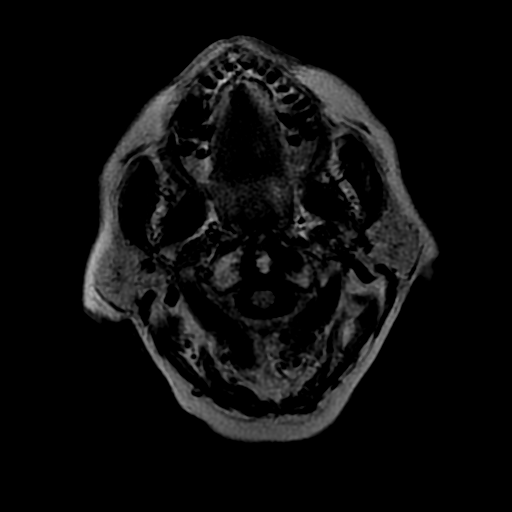
[im 14/27]
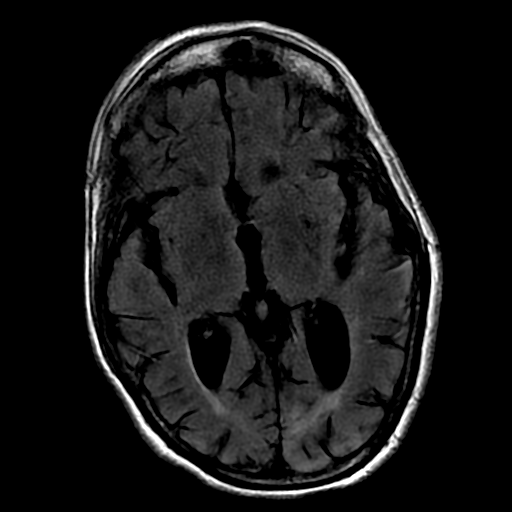
[im 27/27]
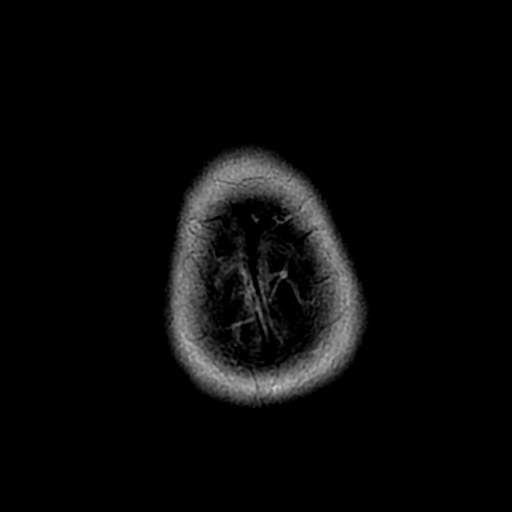

[Series 10: T2 post-contrast · coronal · 5.0mm · 0.39mm/px · 2 of 25 slices shown]
[im 1/25]
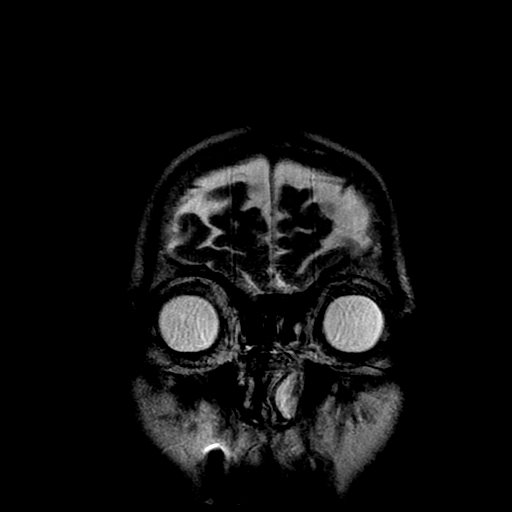
[im 25/25]
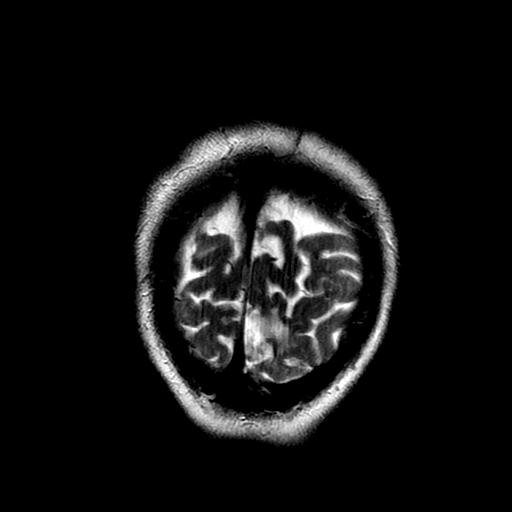

[Series 300: DWI · axial · 3.0mm · 1.09mm/px · z∈[-71,+74]mm · 5 of 49 slices shown (3 of 4)]
[im 1/49]
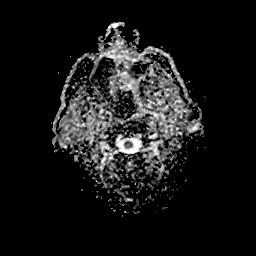
[im 13/49]
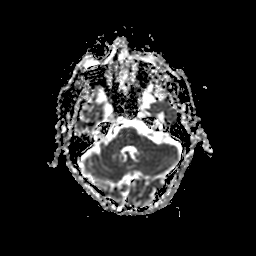
[im 25/49]
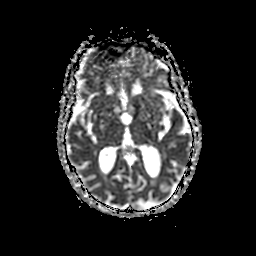
[im 37/49]
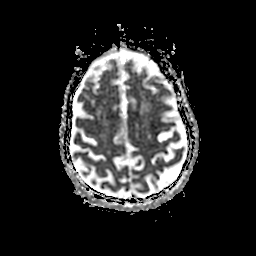
[im 49/49]
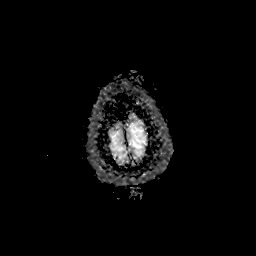

[Series 400: DWI · coronal · 5.0mm · 1.09mm/px · 4 of 44 slices shown (4 of 4)]
[im 1/44]
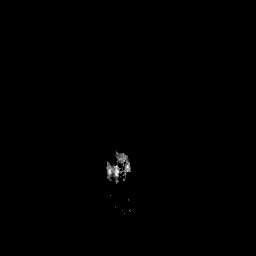
[im 15/44]
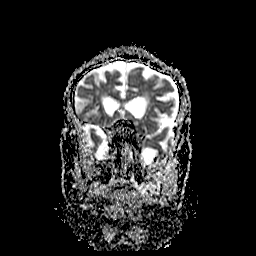
[im 29/44]
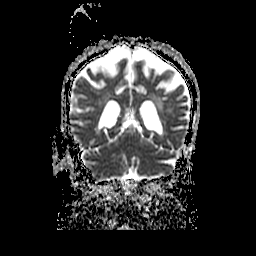
[im 44/44]
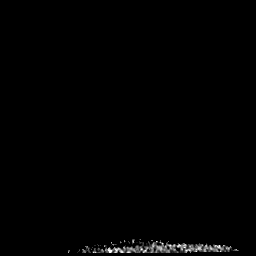

[36 of 48 positions shown; findings below may reference images not displayed]

FINDINGS: Brain: There are small foci of cortical and subcortical restricted
diffusion in the posterior left frontal lobe consistent with acute
infarcts. A single tiny acute cortical infarct is present in the
posterior right frontal lobe. A punctate acute or subacute infarct
is questioned in the left cerebellum (series 3, image 13). There is
also a punctate acute infarct at the lateral aspect of the left
temporal occipital junction.

Scattered chronic microhemorrhages are noted in the left frontal
lobe, bilateral parietal lobes, and right occipital lobe. There is a
small chronic PCA infarct in the left occipital lobe. Patchy T2
hyperintensities elsewhere in the cerebral white matter bilaterally
are nonspecific but compatible with moderate chronic small vessel
ischemic disease. There is moderate cerebral atrophy. Chronic
lacunar infarcts are noted in the left centrum semiovale and along
the lateral margin of the left thalamus. There is a tiny chronic
infarct in the lateral right cerebellum. No mass, midline shift, or
extra-axial fluid collection is identified. A partially empty sella
is noted.

Vascular: Major intracranial vascular flow voids are preserved.

Skull and upper cervical spine: Unremarkable bone marrow signal.

Sinuses/Orbits: Bilateral cataract extraction. Paranasal sinuses and
mastoid air cells are clear.

Other: None.
IMPRESSION: 1. Small acute infarcts in the left greater than right posterior
frontal lobes, left temporo occipital junction, and possibly left
cerebellum.
2. Moderate chronic small vessel ischemic disease. Chronic left
occipital infarct.
